# Patient Record
Sex: Female | Born: 1935 | Race: White | Hispanic: No | Smoking: Former smoker
Health system: Southern US, Community
[De-identification: ages and names within clinical notes are randomized; demographics above are authoritative.]

## PROBLEM LIST (undated history)

## (undated) DIAGNOSIS — S32010A Wedge compression fracture of first lumbar vertebra, initial encounter for closed fracture: Secondary | ICD-10-CM

## (undated) DIAGNOSIS — R739 Hyperglycemia, unspecified: Secondary | ICD-10-CM

## (undated) DIAGNOSIS — I4510 Unspecified right bundle-branch block: Secondary | ICD-10-CM

## (undated) DIAGNOSIS — D649 Anemia, unspecified: Secondary | ICD-10-CM

## (undated) DIAGNOSIS — S42291A Other displaced fracture of upper end of right humerus, initial encounter for closed fracture: Secondary | ICD-10-CM

## (undated) DIAGNOSIS — E039 Hypothyroidism, unspecified: Secondary | ICD-10-CM

## (undated) DIAGNOSIS — I10 Essential (primary) hypertension: Secondary | ICD-10-CM

## (undated) DIAGNOSIS — E079 Disorder of thyroid, unspecified: Secondary | ICD-10-CM

## (undated) DIAGNOSIS — J309 Allergic rhinitis, unspecified: Secondary | ICD-10-CM

## (undated) DIAGNOSIS — M810 Age-related osteoporosis without current pathological fracture: Secondary | ICD-10-CM

## (undated) DIAGNOSIS — N3941 Urge incontinence: Secondary | ICD-10-CM

## (undated) DIAGNOSIS — E785 Hyperlipidemia, unspecified: Secondary | ICD-10-CM

## (undated) DIAGNOSIS — N8110 Cystocele, unspecified: Secondary | ICD-10-CM

## (undated) HISTORY — DX: Age-related osteoporosis without current pathological fracture: M81.0

## (undated) HISTORY — DX: Hyperglycemia, unspecified: R73.9

## (undated) HISTORY — DX: Wedge compression fracture of first lumbar vertebra, initial encounter for closed fracture: S32.010A

## (undated) HISTORY — PX: TONSILLECTOMY: SUR1361

## (undated) HISTORY — PX: CATARACT EXTRACTION W/ INTRAOCULAR LENS  IMPLANT, BILATERAL: SHX1307

## (undated) HISTORY — DX: Cystocele, unspecified: N81.10

## (undated) HISTORY — DX: Unspecified right bundle-branch block: I45.10

## (undated) HISTORY — DX: Urge incontinence: N39.41

## (undated) HISTORY — DX: Allergic rhinitis, unspecified: J30.9

## (undated) HISTORY — DX: Hyperlipidemia, unspecified: E78.5

## (undated) HISTORY — PX: APPENDECTOMY: SHX54

## (undated) HISTORY — DX: Anemia, unspecified: D64.9

---

## 1998-05-11 ENCOUNTER — Other Ambulatory Visit: Admission: RE | Admit: 1998-05-11 | Discharge: 1998-05-11 | Payer: Self-pay | Admitting: Cardiology

## 1998-09-12 ENCOUNTER — Other Ambulatory Visit: Admission: RE | Admit: 1998-09-12 | Discharge: 1998-09-12 | Payer: Self-pay | Admitting: Cardiology

## 1999-07-06 ENCOUNTER — Encounter: Payer: Self-pay | Admitting: Cardiology

## 1999-07-06 ENCOUNTER — Ambulatory Visit (HOSPITAL_COMMUNITY): Admission: RE | Admit: 1999-07-06 | Discharge: 1999-07-06 | Payer: Self-pay | Admitting: Cardiology

## 1999-09-17 ENCOUNTER — Other Ambulatory Visit: Admission: RE | Admit: 1999-09-17 | Discharge: 1999-09-17 | Payer: Self-pay | Admitting: Cardiology

## 1999-09-26 ENCOUNTER — Ambulatory Visit (HOSPITAL_COMMUNITY): Admission: RE | Admit: 1999-09-26 | Discharge: 1999-09-26 | Payer: Self-pay | Admitting: Specialist

## 1999-10-24 ENCOUNTER — Ambulatory Visit (HOSPITAL_COMMUNITY): Admission: RE | Admit: 1999-10-24 | Discharge: 1999-10-24 | Payer: Self-pay | Admitting: Specialist

## 2000-07-07 ENCOUNTER — Encounter: Payer: Self-pay | Admitting: Cardiology

## 2000-07-07 ENCOUNTER — Encounter: Admission: RE | Admit: 2000-07-07 | Discharge: 2000-07-07 | Payer: Self-pay | Admitting: Cardiology

## 2000-09-23 ENCOUNTER — Other Ambulatory Visit: Admission: RE | Admit: 2000-09-23 | Discharge: 2000-09-23 | Payer: Self-pay | Admitting: Cardiology

## 2003-05-24 ENCOUNTER — Other Ambulatory Visit: Admission: RE | Admit: 2003-05-24 | Discharge: 2003-05-24 | Payer: Self-pay | Admitting: Geriatric Medicine

## 2008-02-11 ENCOUNTER — Inpatient Hospital Stay (HOSPITAL_COMMUNITY): Admission: EM | Admit: 2008-02-11 | Discharge: 2008-02-16 | Payer: Self-pay | Admitting: Emergency Medicine

## 2008-02-16 ENCOUNTER — Ambulatory Visit: Payer: Self-pay | Admitting: Internal Medicine

## 2008-03-08 LAB — CBC WITH DIFFERENTIAL/PLATELET
BASO%: 0.6 % (ref 0.0–2.0)
Basophils Absolute: 0 10*3/uL (ref 0.0–0.1)
EOS%: 3.1 % (ref 0.0–7.0)
HCT: 37 % (ref 34.8–46.6)
HGB: 12.6 g/dL (ref 11.6–15.9)
MCH: 30.8 pg (ref 26.0–34.0)
MONO#: 0.4 10*3/uL (ref 0.1–0.9)
NEUT%: 68.6 % (ref 39.6–76.8)
RDW: 14.6 % — ABNORMAL HIGH (ref 11.3–14.5)
WBC: 6 10*3/uL (ref 3.9–10.0)
lymph#: 1.2 10*3/uL (ref 0.9–3.3)

## 2008-03-08 LAB — COMPREHENSIVE METABOLIC PANEL
AST: 15 U/L (ref 0–37)
Albumin: 4.5 g/dL (ref 3.5–5.2)
Alkaline Phosphatase: 51 U/L (ref 39–117)
BUN: 18 mg/dL (ref 6–23)
Calcium: 9.2 mg/dL (ref 8.4–10.5)
Chloride: 105 mEq/L (ref 96–112)
Potassium: 4.1 mEq/L (ref 3.5–5.3)
Sodium: 142 mEq/L (ref 135–145)
Total Protein: 6.6 g/dL (ref 6.0–8.3)

## 2009-11-01 ENCOUNTER — Other Ambulatory Visit: Admission: RE | Admit: 2009-11-01 | Discharge: 2009-11-01 | Payer: Self-pay | Admitting: Obstetrics and Gynecology

## 2011-03-12 NOTE — Consult Note (Signed)
NAMEJOHNNAY, PLEITEZ           ACCOUNT NO.:  [id number]   MEDICAL RECORD NO.:  [id number]          PATIENT TYPE:  EMS   LOCATION:  ED                           FACILITY:  [facility]   PHYSICIAN:  Griffith Citron, M.D.DATE OF BIRTH:  09-16-1922   DATE OF CONSULTATION:  02/10/2008  DATE OF DISCHARGE:                                 CONSULTATION   PROCEDURE:  Nausea, vomiting, and diarrhea.   HISTORY OF PRESENT ILLNESS:  Ms. Storr is an 77 year old white female  well known to me, currently undergoing GI evaluation.  The patient was  seen initially in consultation by me two weeks ago as an outpatient with  a six week history of episodic nausea and vomiting.  She had had three  discrete episodes during the prior several weeks but no episode for at  least three weeks prior to her initial GI consultation.  Bowel habits at  that time tended towards constipation.  There were no obvious physical  findings.  With the exception of a gallbladder ultrasound, no additional  evaluation was undertaken.  Abdominal ultrasound was negative for  gallstones with mild atherosclerotic change of the aorta.  The patient  was followed expectantly.   Three days ago, the patient was seen after a two week hiatus because of  change in bowel habits with diarrhea occurring on an almost daily basis,  5-10 watery stools per day, no blood, pus or mucous.  Urgency with  minimal cramping and no complaints of abdominal distention or pain.  Symptoms appeared to be independent of food intake or physical activity.  Associated with the change in bowel habits, she was experiencing nausea  and had at least one episode of vomiting.  Further evaluation included  lab with mild leukocytosis, mild hypokalemia with normal celiac profile.  Abdominal two way film was obtained, but the report is pending at the  present time.   The patient, again today, had the acute recurrence of episodic nausea,  vomiting, and diarrhea.  This time  becoming pre-syncopal.  She is in a  rehabilitation home and was brought to [facility] Emergency Room by her  son because of her debilitated state, ongoing symptoms, without  diagnosis or therapeutic plan.   PAST MEDICAL HISTORY:  Hypertension, hypothyroid.   REVIEW OF SYSTEMS:  Night sweats beginning during the past two weeks.  Decreased energy.  Appetite remains good.  Weight has increased.   PERSONAL/SOCIAL:  The patient is widowed.  She lives in an assisted  living facility.  She is independent driving.  Her son, Gregary Signs, is her  primary caregiver.  Her daughter-in-law, is an Charity fundraiser, Marnette Burgess.   PHYSICAL EXAMINATION:  GENERAL:  Elderly white female, alert, talkative.  Vital signs stable.  No acute distress.  Afebrile.  HEENT:  Anicteric sclerae, pink conjunctivae, EOMI.  Mouth dry mucous  membranes.  No oropharyngeal lesion of lip, tongue, or gums.  NECK: Supple, no adenopathy, no thyromegaly or bruits.  CHEST:  Clear to auscultation without adventitious sounds.  CARDIAC:  Regular rhythm, no gallop, no murmur.  ABDOMEN:  Soft, nontender, nondistended, no palpable organomegaly, no  firmness,  no mass.  Bowel sounds are present throughout.  RECTAL:  Not performed.  EXTREMITIES:  Peripheral wasting.  No edema, no clubbing or cyanosis.  Good capillary refill.  Pulses equal bilaterally.   ASSESSMENT:  1. Diarrhea.  2. Nausea and vomiting.  3. Dehydration secondary to above.   The etiology of the patient's current illness is unknown.  It is  characterized by episodic nausea, vomiting, and diarrhea.  Associated  symptoms of weakness, decreased energy, and night sweats are more recent  features.  It is not clear if this is upper or lower GI in origin with  considerations including peptic disease, Candida esophagitis, infectious  colitis including possible pseudomembranous colitis, intestinal angina,  or ischemic colitis.  An underlying systemic disorder also needs to be  considered  with secondary GI manifestations, e.g. Addison's disease,  lymphoma.  GI neoplasia is also well within the differential.   RECOMMENDATIONS:  1. Hydrate, replace potassium.  2. Clear liquids.  3. Anti-emetics.  4. Panendoscopy and flexible sigmoidoscopy anticipated for tomorrow.  5. Stool evaluation, hemoccult x3, C&S, WBC, C. difficile toxin, EIA-      Giardia, microsporidia.  6. PPI - Protonix 40 mg IV q.24h.  7. Consider abdominal CT, mesenteric angiogram.      Griffith Citron, M.D.  Electronically Signed     JRM/MEDQ  D:  02/10/2008  T:  02/10/2008  Job:  [id number]   cc:   Hal T. Stoneking, M.D.  Fax: [phone]

## 2011-03-12 NOTE — Discharge Summary (Signed)
Emily Spence, Emily Spence           ACCOUNT NO.:  [id number]   MEDICAL RECORD NO.:  [id number]          PATIENT TYPE:  INP   LOCATION:  1437                         FACILITY:  [facility]   PHYSICIAN:  Corinna L. Lendell Caprice, MDDATE OF BIRTH:  12-05-1921   DATE OF ADMISSION:  02/11/2008  DATE OF DISCHARGE:  02/16/2008                               DISCHARGE SUMMARY   DIAGNOSES:  1. Intermittent diarrhea and nausea  2. Dehydration.  3. Hypokalemia.  4. Hypertension.  5. Hypothyroidism.  6. Do not resuscitate.   MEDICATIONS:  1. Prilosec 20 mg a day.  2. Cholestyramine 4 gm twice a day as needed for diarrhea.  3. She is to continue lisinopril 5 mg a day.  4. Hydrochlorothiazide 12.5 mg a day.  5. Synthroid 50 mcg a day.  6. Felodipine ER 2.5 mg a day.   Follow up with Dr. Kinnie Scales next week.  Follow up with Dr. Lajuana Matte for anemia workup.   CONDITION:  Stable.   CONSULTATIONS:  Dr. Sharrell Ku.   PROCEDURES:  EGD, which showed erosive esophagitis, Schatzki's ring,  savary dilatation, hiatal hernia.  Colonoscopy showed hemorrhoids,  diverticulosis.  Capsule endoscopy result unavailable.   DISCHARGE DIET:  As tolerated.   ACTIVITY:  Ad lib.   LABORATORY DATA:  CBC initially significant for a white blood cell count  of 12,000, but this normalized without antibiotics.  Her eosinophil  count was 12%.  On February 13, 2008, white blood cell count was 10,000  with 47% neutrophils, 14% lymphocytes and 34% eosinophils.  Erythrocyte  sedimentation rate was 1.  INR 1.2, PTT 26.  Initial complete metabolic  panel significant for a potassium of 3.0, total protein 5.5, albumin  3.1.  At discharge, her potassium was 3.5.  Point of care enzymes  negative.  TSH normal.  Gastrin normal at 28.  Serum IgA level was  normal.  Serum IgG level was slightly low at 673.  Serum IgM level was  low at 16.  Serum protein electrophoresis negative for an M spike.  A 24-  hour urine for  5-hydroxyindoleacetic was normal at 1.8.  Urine  metanephrines normal.  Urinalysis showed a specific gravity of 1.007,  trace blood, trace leukocyte esterase, negative nitrites.  ANA negative.  Giardia negative.  Cryptosporidia negative.  Hemoccult of the stool was  positive.  C diff was negative x2.  Stool culture was negative.  Fecal  lactoferrin was positive.  Ova and parasite negative.   SPECIAL STUDIES/RADIOLOGY:  Abdominal x-ray was nonobstructive.  CT of  the abdomen and pelvis showed nothing acute, likely small gallstones.  Small bowel series showed nothing acute.  EKG showed a normal sinus  rhythm, right bundle branch block.   HISTORY AND HOSPITAL COURSE:  Please see H&P for complete admission  details.  Emily Spence is an 77 year old white female with periodic  intermittent diarrhea and nausea.  This has been occurring for several  weeks, usually several days in a row and then stops.  She has 5-10  watery stools a day.  During this time, she had been seen by Dr. Kinnie Scales  as an outpatient and had a negative workup for celiac sprue, but  presented to the emergency room with weakness, nausea and continued  diarrhea.  She was found to have normal vital signs, dry mucous  membranes, poor skin turgor, soft abdomen.  She was hypokalemic and felt  to be dehydrated.  She was started on IV fluids.  Her  hydrochlorothiazide and lisinopril were held.  Her potassium was  repleted.  Dr. Kinnie Scales was consulted and performed the above procedures.  He ordered a capsule endoscopy, and I do not have this result.  Small  bowel follow-through was normal.  The patient's diarrhea resolved and  she was tolerating a regular diet.  Potassium was adequately repleted.  She was started back on her antihypertensives and feeling much better.  Her nausea and vomiting had resolved and she was ready to go home.  Further workup per Dr. Kinnie Scales.      Corinna L. Lendell Caprice, MD  Electronically Signed     CLS/MEDQ   D:  02/23/2008  T:  02/23/2008  Job:  [id number]   cc:   Hal T. Stoneking, M.D.  Fax: [phone]   Lajuana Matte, MD  Fax: [phone]

## 2011-03-12 NOTE — H&P (Signed)
Emily Spence, Emily Spence           ACCOUNT NO.:  [id number]   MEDICAL RECORD NO.:  [id number]          PATIENT TYPE:  EMS   LOCATION:  ED                           [facility]ACILITY:  [facility]   PHYSICIAN:  Michiel Cowboy, MDDATE OF BIRTH:  14-Feb-1922   DATE OF ADMISSION:  02/10/2008  DATE OF DISCHARGE:                              HISTORY & PHYSICAL   PRIMARY CARE PHYSICIAN:  Hal T. Stoneking, M.D.   CHIEF COMPLAINT:  Nausea, vomiting, and diarrhea.   CONSULTATIONS:  Griffith Citron, M.D., GI.   HISTORY OF PRESENT ILLNESS:  The patient is an 77 year old female, well  known to Dr. Kinnie Scales who for the past week or so has been suffering from  intermittent episodes of diarrhea.  The patient describes episodes of  having frequent bowel movements for four days, spontaneously resolved  and then resume again.  For the past 24 hours, she has been having  frequent bowel movements and runny bowel movements, about five or six by  now and they do not stop.  She also has been vomiting and been nauseous  which is new for her.  Of note, the patient has had history of  constipation actually in the past which has triggered GI consultation  and the nausea, vomiting, and diarrhea has been a drastic change from  prior.  Per Dr. Jennye Boroughs note, she has already undergone work-up for  celiac sprue which was negative, plain films and ultrasound was obtained  which were within normal limits per family.  Dr. Kinnie Scales is planning to  perform an endoscopy on the patient tomorrow for further evaluation of  her symptoms, but wishes medicine to admit the patient overnight and  hydrate her as the patient continues to have nausea, vomiting and  diarrhea.   REVIEW OF SYSTEMS:  Denies fevers, chills, abdominal pain, chest pain,  shortness of breath, otherwise negative except for his HPI.   PAST MEDICAL HISTORY:  1. Hypertension.  2. Hypothyroidism.   ALLERGIES:  SULFA.   MEDICATIONS:  Lisinopril, Synthroid, and  hydrochlorothiazide, the doses  of which she does not know.   SOCIAL HISTORY:  The patient lives in the [facility].  Denies alcohol,  drugs or tobacco abuse.  Lives in an assisted living facility.   FAMILY HISTORY:  Noncontributory.   PHYSICAL EXAMINATION:  VITAL SIGNS:  Blood pressure 136/60, respirations  18, heart rate 74, temperature 98.9, oxygen saturation 93% on room air.  GENERAL:  The patient is an elderly female in no acute distress, lying  down on the stretcher.  Dry mucous membranes.  Decreased skin turgor.  HEENT:  Atraumatic.  LUNGS:  Clear to auscultation bilaterally.  HEART:  Regular rate and rhythm.  No murmurs, rubs or gallops.  ABDOMEN:  Soft, nontender, nondistended.  EXTREMITIES:  Lower extremities without edema.  NEUROLOGIC:  Intact.   LABORATORY DATA:  White blood cell count 12.7, hemoglobin 12.3,  platelets 175.  Potassium 3, sodium 138, bicarb 22, creatinine 0.7.  LFTs within normal limits.  Total protein 5.5, albumin 3.1.  Urinalysis  positive leukocyte esterase, but wbc's 0-2.  No films obtained in the  __________.  EKG showing right bundle branch block with no evidence of  acute ischemia and function.   ASSESSMENT/PLAN:  This is an 77 year old female with history of nausea,  vomiting, and diarrhea for the past one week and now nausea and vomiting  for the past 24 hours and worsening diarrhea.  1. Nausea and vomiting and diarrhea.  As per Dr. Jennye Boroughs note,      etiology unclear.  Defer to GI for further work-up.  Would also      send stool studies and Clostridium difficile, lactate levels as      this could be secondary to ischemic process. Obtain plain film of      the abdomen to be sure no new developments have happened since the      patient has been worsening for the past 24 hour.  Refer to GI if      other imaging would be necessary.  2. Dehydration.  Will rehydrate overnight with normal saline.  3. Hypokalemia.  Will put her on telemetry and  replace.  Will follow      labs in the morning.  4. Hypertension.  Will hold HCTZ and lisinopril for now given that the      patient is dehydrated.  Will watch blood pressure and restart      medications as needed.  5. Hypothyroidism.  Check TSH level.  Restart home dose of medications      once son brings them in so we know what dose the patient is on.  6. Prophylaxis. Protonix and SCDs.  7. Code status. The patient is DNR/DNI per discussion with the family      and patient, but wishes aggressive care up to a point but do not      include resuscitation.      Michiel Cowboy, MD  Electronically Signed     AVD/MEDQ  D:  02/11/2008  T:  02/11/2008  Job:  [id number]   cc:   Griffith Citron, M.D.  Fax: [phone]   Hal T. Stoneking, M.D.  Fax: [phone]

## 2011-07-23 LAB — DIFFERENTIAL
Basophils Absolute: 0
Basophils Relative: 0
Basophils Relative: 0
Eosinophils Absolute: 1.5 — ABNORMAL HIGH
Eosinophils Relative: 12 — ABNORMAL HIGH
Eosinophils Relative: 34 — ABNORMAL HIGH
Lymphocytes Relative: 6 — ABNORMAL LOW
Lymphs Abs: 0.8
Monocytes Absolute: 0.5
Monocytes Absolute: 0.6
Monocytes Relative: 5
Neutro Abs: 4.7
Neutro Abs: 9.8 — ABNORMAL HIGH
Neutrophils Relative %: 47
Neutrophils Relative %: 77

## 2011-07-23 LAB — CLOSTRIDIUM DIFFICILE EIA
C difficile Toxins A+B, EIA: NEGATIVE
C difficile Toxins A+B, EIA: NEGATIVE

## 2011-07-23 LAB — CBC
HCT: 30.5 — ABNORMAL LOW
HCT: 37.2
Hemoglobin: 10.3 — ABNORMAL LOW
Hemoglobin: 10.4 — ABNORMAL LOW
Hemoglobin: 11 — ABNORMAL LOW
Hemoglobin: 12.3
MCHC: 33
MCHC: 33.5
MCHC: 33.7
MCHC: 33.8
MCV: 90.5
MCV: 90.9
MCV: 91.2
MCV: 91.9
Platelets: 175
RBC: 3.39 — ABNORMAL LOW
RBC: 3.57 — ABNORMAL LOW
RBC: 4.09
RDW: 13.2
RDW: 13.6
RDW: 14.6
WBC: 10
WBC: 12.7 — ABNORMAL HIGH

## 2011-07-23 LAB — BASIC METABOLIC PANEL
CO2: 21
CO2: 24
CO2: 29
Calcium: 7.3 — ABNORMAL LOW
Chloride: 106
Chloride: 113 — ABNORMAL HIGH
Creatinine, Ser: 0.6
Creatinine, Ser: 0.6
GFR calc Af Amer: >60
GFR calc Af Amer: >60
GFR calc non Af Amer: >60
Glucose, Bld: 107 — ABNORMAL HIGH
Glucose, Bld: 95
Potassium: 3.7
Sodium: 138
Sodium: 140
Sodium: 142

## 2011-07-23 LAB — URINALYSIS, ROUTINE W REFLEX MICROSCOPIC
Bilirubin Urine: NEGATIVE
Glucose, UA: NEGATIVE
Hgb urine dipstick: NEGATIVE
Nitrite: NEGATIVE
Protein, ur: NEGATIVE
Specific Gravity, Urine: 1.007
Urobilinogen, UA: 0.2
pH: 6

## 2011-07-23 LAB — GIARDIA/CRYPTOSPORIDIUM SCREEN(EIA)

## 2011-07-23 LAB — LACTIC ACID, PLASMA: Lactic Acid, Venous: 0.7

## 2011-07-23 LAB — COMPREHENSIVE METABOLIC PANEL
ALT: 13
AST: 24
Albumin: 2.4 — ABNORMAL LOW
Albumin: 3.1 — ABNORMAL LOW
Alkaline Phosphatase: 46
BUN: 5 — ABNORMAL LOW
Calcium: 8.5
Chloride: 113 — ABNORMAL HIGH
Creatinine, Ser: 0.67
GFR calc Af Amer: >60
GFR calc non Af Amer: >60
Glucose, Bld: 111 — ABNORMAL HIGH
Potassium: 4
Sodium: 138
Total Bilirubin: 0.8
Total Protein: 5.5 — ABNORMAL LOW

## 2011-07-23 LAB — PROTIME-INR
INR: 1.2
Prothrombin Time: 15.5 — ABNORMAL HIGH

## 2011-07-23 LAB — URINE MICROSCOPIC-ADD ON

## 2011-07-23 LAB — 5 HIAA, QUANTITATIVE, URINE, 24 HOUR
5-HIAA, 24 Hr Urine: 1.8 mg/24 h (ref <=6.0)
Volume, Urine-5HIAA: 2500 mL/24 h

## 2011-07-23 LAB — COMPREHENSIVE METABOLIC PANEL WITH GFR
Alkaline Phosphatase: 61
BUN: 8
CO2: 22
Chloride: 106
Glucose, Bld: 120 — ABNORMAL HIGH
Potassium: 3 — ABNORMAL LOW
Total Bilirubin: 1.1

## 2011-07-23 LAB — LIPASE, BLOOD: Lipase: 17

## 2011-07-23 LAB — OVA AND PARASITE EXAMINATION: Ova and parasites: NONE SEEN

## 2011-07-23 LAB — PROTEIN ELECTROPH W RFLX QUANT IMMUNOGLOBULINS
Alpha-2-Globulin: 14.5 — ABNORMAL HIGH
Beta 2: 4.5
Beta Globulin: 5.7
Gamma Globulin: 11 — ABNORMAL LOW
M-Spike, %: NOT DETECTED
Total Protein ELP: 5.6 — ABNORMAL LOW

## 2011-07-23 LAB — GASTRIN: Gastrin: 28 pg/mL (ref 13–115)

## 2011-07-23 LAB — IGG, IGA, IGM
IgA: 190
IgG (Immunoglobin G), Serum: 673 — ABNORMAL LOW
IgM, Serum: 16 — ABNORMAL LOW

## 2011-07-23 LAB — METANEPHRINES, URINE, 24 HOUR: Volume, Urine-METAN: 2500

## 2011-07-23 LAB — OCCULT BLOOD X 1 CARD TO LAB, STOOL: Fecal Occult Bld: POSITIVE

## 2011-07-23 LAB — STOOL CULTURE

## 2011-07-23 LAB — IMMUNOFIXATION ADD-ON

## 2011-12-03 DIAGNOSIS — I10 Essential (primary) hypertension: Secondary | ICD-10-CM | POA: Diagnosis not present

## 2011-12-11 DIAGNOSIS — H3530 Unspecified macular degeneration: Secondary | ICD-10-CM | POA: Diagnosis not present

## 2012-04-21 DIAGNOSIS — R110 Nausea: Secondary | ICD-10-CM | POA: Diagnosis not present

## 2012-04-21 DIAGNOSIS — M549 Dorsalgia, unspecified: Secondary | ICD-10-CM | POA: Diagnosis not present

## 2012-04-23 DIAGNOSIS — R279 Unspecified lack of coordination: Secondary | ICD-10-CM | POA: Diagnosis not present

## 2012-04-23 DIAGNOSIS — R269 Unspecified abnormalities of gait and mobility: Secondary | ICD-10-CM | POA: Diagnosis not present

## 2012-04-23 DIAGNOSIS — M810 Age-related osteoporosis without current pathological fracture: Secondary | ICD-10-CM | POA: Diagnosis not present

## 2012-04-23 DIAGNOSIS — Z9181 History of falling: Secondary | ICD-10-CM | POA: Diagnosis not present

## 2012-04-23 DIAGNOSIS — M6281 Muscle weakness (generalized): Secondary | ICD-10-CM | POA: Diagnosis not present

## 2012-04-23 DIAGNOSIS — M545 Low back pain: Secondary | ICD-10-CM | POA: Diagnosis not present

## 2012-04-24 DIAGNOSIS — R279 Unspecified lack of coordination: Secondary | ICD-10-CM | POA: Diagnosis not present

## 2012-04-24 DIAGNOSIS — M810 Age-related osteoporosis without current pathological fracture: Secondary | ICD-10-CM | POA: Diagnosis not present

## 2012-04-24 DIAGNOSIS — M6281 Muscle weakness (generalized): Secondary | ICD-10-CM | POA: Diagnosis not present

## 2012-04-24 DIAGNOSIS — Z9181 History of falling: Secondary | ICD-10-CM | POA: Diagnosis not present

## 2012-04-24 DIAGNOSIS — R269 Unspecified abnormalities of gait and mobility: Secondary | ICD-10-CM | POA: Diagnosis not present

## 2012-04-24 DIAGNOSIS — M545 Low back pain: Secondary | ICD-10-CM | POA: Diagnosis not present

## 2012-04-27 DIAGNOSIS — M6281 Muscle weakness (generalized): Secondary | ICD-10-CM | POA: Diagnosis not present

## 2012-04-27 DIAGNOSIS — R279 Unspecified lack of coordination: Secondary | ICD-10-CM | POA: Diagnosis not present

## 2012-04-27 DIAGNOSIS — R269 Unspecified abnormalities of gait and mobility: Secondary | ICD-10-CM | POA: Diagnosis not present

## 2012-04-27 DIAGNOSIS — M810 Age-related osteoporosis without current pathological fracture: Secondary | ICD-10-CM | POA: Diagnosis not present

## 2012-04-27 DIAGNOSIS — M545 Low back pain, unspecified: Secondary | ICD-10-CM | POA: Diagnosis not present

## 2012-04-27 DIAGNOSIS — Z9181 History of falling: Secondary | ICD-10-CM | POA: Diagnosis not present

## 2012-04-28 DIAGNOSIS — F05 Delirium due to known physiological condition: Secondary | ICD-10-CM | POA: Diagnosis not present

## 2012-04-28 DIAGNOSIS — M8448XD Pathological fracture, other site, subsequent encounter for fracture with routine healing: Secondary | ICD-10-CM | POA: Diagnosis not present

## 2012-04-28 DIAGNOSIS — I10 Essential (primary) hypertension: Secondary | ICD-10-CM | POA: Diagnosis not present

## 2012-05-21 DIAGNOSIS — D649 Anemia, unspecified: Secondary | ICD-10-CM | POA: Diagnosis not present

## 2012-05-21 DIAGNOSIS — M810 Age-related osteoporosis without current pathological fracture: Secondary | ICD-10-CM | POA: Diagnosis not present

## 2012-05-21 DIAGNOSIS — I10 Essential (primary) hypertension: Secondary | ICD-10-CM | POA: Diagnosis not present

## 2012-05-21 DIAGNOSIS — Z79899 Other long term (current) drug therapy: Secondary | ICD-10-CM | POA: Diagnosis not present

## 2012-05-21 DIAGNOSIS — Z1331 Encounter for screening for depression: Secondary | ICD-10-CM | POA: Diagnosis not present

## 2012-06-01 DIAGNOSIS — R279 Unspecified lack of coordination: Secondary | ICD-10-CM | POA: Diagnosis not present

## 2012-06-01 DIAGNOSIS — M545 Low back pain: Secondary | ICD-10-CM | POA: Diagnosis not present

## 2012-06-01 DIAGNOSIS — Z9181 History of falling: Secondary | ICD-10-CM | POA: Diagnosis not present

## 2012-06-03 DIAGNOSIS — Z9181 History of falling: Secondary | ICD-10-CM | POA: Diagnosis not present

## 2012-06-03 DIAGNOSIS — M545 Low back pain: Secondary | ICD-10-CM | POA: Diagnosis not present

## 2012-06-03 DIAGNOSIS — R279 Unspecified lack of coordination: Secondary | ICD-10-CM | POA: Diagnosis not present

## 2012-06-05 DIAGNOSIS — Z9181 History of falling: Secondary | ICD-10-CM | POA: Diagnosis not present

## 2012-06-05 DIAGNOSIS — M545 Low back pain: Secondary | ICD-10-CM | POA: Diagnosis not present

## 2012-06-05 DIAGNOSIS — R279 Unspecified lack of coordination: Secondary | ICD-10-CM | POA: Diagnosis not present

## 2012-06-10 DIAGNOSIS — M545 Low back pain: Secondary | ICD-10-CM | POA: Diagnosis not present

## 2012-06-10 DIAGNOSIS — Z9181 History of falling: Secondary | ICD-10-CM | POA: Diagnosis not present

## 2012-06-10 DIAGNOSIS — R279 Unspecified lack of coordination: Secondary | ICD-10-CM | POA: Diagnosis not present

## 2012-06-15 DIAGNOSIS — R279 Unspecified lack of coordination: Secondary | ICD-10-CM | POA: Diagnosis not present

## 2012-06-15 DIAGNOSIS — M545 Low back pain: Secondary | ICD-10-CM | POA: Diagnosis not present

## 2012-06-15 DIAGNOSIS — Z9181 History of falling: Secondary | ICD-10-CM | POA: Diagnosis not present

## 2012-06-16 DIAGNOSIS — Z9181 History of falling: Secondary | ICD-10-CM | POA: Diagnosis not present

## 2012-06-16 DIAGNOSIS — M545 Low back pain: Secondary | ICD-10-CM | POA: Diagnosis not present

## 2012-06-16 DIAGNOSIS — R279 Unspecified lack of coordination: Secondary | ICD-10-CM | POA: Diagnosis not present

## 2012-06-17 DIAGNOSIS — Z9181 History of falling: Secondary | ICD-10-CM | POA: Diagnosis not present

## 2012-06-17 DIAGNOSIS — R279 Unspecified lack of coordination: Secondary | ICD-10-CM | POA: Diagnosis not present

## 2012-06-17 DIAGNOSIS — M545 Low back pain: Secondary | ICD-10-CM | POA: Diagnosis not present

## 2012-06-18 DIAGNOSIS — R279 Unspecified lack of coordination: Secondary | ICD-10-CM | POA: Diagnosis not present

## 2012-06-18 DIAGNOSIS — M545 Low back pain: Secondary | ICD-10-CM | POA: Diagnosis not present

## 2012-06-18 DIAGNOSIS — Z9181 History of falling: Secondary | ICD-10-CM | POA: Diagnosis not present

## 2012-06-19 DIAGNOSIS — M545 Low back pain: Secondary | ICD-10-CM | POA: Diagnosis not present

## 2012-06-19 DIAGNOSIS — Z9181 History of falling: Secondary | ICD-10-CM | POA: Diagnosis not present

## 2012-06-19 DIAGNOSIS — R279 Unspecified lack of coordination: Secondary | ICD-10-CM | POA: Diagnosis not present

## 2012-06-22 DIAGNOSIS — M545 Low back pain: Secondary | ICD-10-CM | POA: Diagnosis not present

## 2012-06-22 DIAGNOSIS — Z9181 History of falling: Secondary | ICD-10-CM | POA: Diagnosis not present

## 2012-06-22 DIAGNOSIS — R279 Unspecified lack of coordination: Secondary | ICD-10-CM | POA: Diagnosis not present

## 2012-06-24 DIAGNOSIS — Z9181 History of falling: Secondary | ICD-10-CM | POA: Diagnosis not present

## 2012-06-24 DIAGNOSIS — M545 Low back pain: Secondary | ICD-10-CM | POA: Diagnosis not present

## 2012-06-24 DIAGNOSIS — R279 Unspecified lack of coordination: Secondary | ICD-10-CM | POA: Diagnosis not present

## 2012-06-25 DIAGNOSIS — R279 Unspecified lack of coordination: Secondary | ICD-10-CM | POA: Diagnosis not present

## 2012-06-25 DIAGNOSIS — M545 Low back pain: Secondary | ICD-10-CM | POA: Diagnosis not present

## 2012-06-25 DIAGNOSIS — Z9181 History of falling: Secondary | ICD-10-CM | POA: Diagnosis not present

## 2012-06-26 DIAGNOSIS — R279 Unspecified lack of coordination: Secondary | ICD-10-CM | POA: Diagnosis not present

## 2012-06-26 DIAGNOSIS — M545 Low back pain: Secondary | ICD-10-CM | POA: Diagnosis not present

## 2012-06-26 DIAGNOSIS — Z9181 History of falling: Secondary | ICD-10-CM | POA: Diagnosis not present

## 2012-06-29 DIAGNOSIS — M545 Low back pain: Secondary | ICD-10-CM | POA: Diagnosis not present

## 2012-06-29 DIAGNOSIS — R279 Unspecified lack of coordination: Secondary | ICD-10-CM | POA: Diagnosis not present

## 2012-06-29 DIAGNOSIS — Z9181 History of falling: Secondary | ICD-10-CM | POA: Diagnosis not present

## 2012-07-01 DIAGNOSIS — Z9181 History of falling: Secondary | ICD-10-CM | POA: Diagnosis not present

## 2012-07-01 DIAGNOSIS — R279 Unspecified lack of coordination: Secondary | ICD-10-CM | POA: Diagnosis not present

## 2012-07-01 DIAGNOSIS — M545 Low back pain: Secondary | ICD-10-CM | POA: Diagnosis not present

## 2012-07-08 DIAGNOSIS — Z9181 History of falling: Secondary | ICD-10-CM | POA: Diagnosis not present

## 2012-07-08 DIAGNOSIS — R279 Unspecified lack of coordination: Secondary | ICD-10-CM | POA: Diagnosis not present

## 2012-07-08 DIAGNOSIS — M545 Low back pain: Secondary | ICD-10-CM | POA: Diagnosis not present

## 2012-07-23 DIAGNOSIS — I998 Other disorder of circulatory system: Secondary | ICD-10-CM | POA: Diagnosis not present

## 2012-07-23 DIAGNOSIS — D649 Anemia, unspecified: Secondary | ICD-10-CM | POA: Diagnosis not present

## 2012-07-23 DIAGNOSIS — E039 Hypothyroidism, unspecified: Secondary | ICD-10-CM | POA: Diagnosis not present

## 2012-07-23 DIAGNOSIS — I10 Essential (primary) hypertension: Secondary | ICD-10-CM | POA: Diagnosis not present

## 2012-07-23 DIAGNOSIS — Z23 Encounter for immunization: Secondary | ICD-10-CM | POA: Diagnosis not present

## 2012-08-19 DIAGNOSIS — H905 Unspecified sensorineural hearing loss: Secondary | ICD-10-CM | POA: Diagnosis not present

## 2012-11-11 DIAGNOSIS — H3530 Unspecified macular degeneration: Secondary | ICD-10-CM | POA: Diagnosis not present

## 2013-01-14 DIAGNOSIS — I10 Essential (primary) hypertension: Secondary | ICD-10-CM | POA: Diagnosis not present

## 2013-01-14 DIAGNOSIS — D649 Anemia, unspecified: Secondary | ICD-10-CM | POA: Diagnosis not present

## 2013-01-22 DIAGNOSIS — L539 Erythematous condition, unspecified: Secondary | ICD-10-CM | POA: Diagnosis not present

## 2013-01-22 DIAGNOSIS — R079 Chest pain, unspecified: Secondary | ICD-10-CM | POA: Diagnosis not present

## 2013-01-22 DIAGNOSIS — R21 Rash and other nonspecific skin eruption: Secondary | ICD-10-CM | POA: Diagnosis not present

## 2013-07-01 DIAGNOSIS — E039 Hypothyroidism, unspecified: Secondary | ICD-10-CM | POA: Diagnosis not present

## 2013-07-01 DIAGNOSIS — Z79899 Other long term (current) drug therapy: Secondary | ICD-10-CM | POA: Diagnosis not present

## 2013-07-01 DIAGNOSIS — I10 Essential (primary) hypertension: Secondary | ICD-10-CM | POA: Diagnosis not present

## 2013-08-18 DIAGNOSIS — H3530 Unspecified macular degeneration: Secondary | ICD-10-CM | POA: Diagnosis not present

## 2013-08-18 DIAGNOSIS — Z961 Presence of intraocular lens: Secondary | ICD-10-CM | POA: Diagnosis not present

## 2013-10-29 DIAGNOSIS — L820 Inflamed seborrheic keratosis: Secondary | ICD-10-CM | POA: Diagnosis not present

## 2013-10-29 DIAGNOSIS — L570 Actinic keratosis: Secondary | ICD-10-CM | POA: Diagnosis not present

## 2013-12-30 DIAGNOSIS — I10 Essential (primary) hypertension: Secondary | ICD-10-CM | POA: Diagnosis not present

## 2013-12-30 DIAGNOSIS — Z0000 Encounter for general adult medical examination without abnormal findings: Secondary | ICD-10-CM | POA: Diagnosis not present

## 2013-12-30 DIAGNOSIS — Z1331 Encounter for screening for depression: Secondary | ICD-10-CM | POA: Diagnosis not present

## 2013-12-30 DIAGNOSIS — Z23 Encounter for immunization: Secondary | ICD-10-CM | POA: Diagnosis not present

## 2013-12-30 DIAGNOSIS — Z7189 Other specified counseling: Secondary | ICD-10-CM | POA: Diagnosis not present

## 2014-06-24 DIAGNOSIS — L578 Other skin changes due to chronic exposure to nonionizing radiation: Secondary | ICD-10-CM | POA: Diagnosis not present

## 2014-06-24 DIAGNOSIS — L570 Actinic keratosis: Secondary | ICD-10-CM | POA: Diagnosis not present

## 2014-06-24 DIAGNOSIS — L738 Other specified follicular disorders: Secondary | ICD-10-CM | POA: Diagnosis not present

## 2014-07-07 DIAGNOSIS — I10 Essential (primary) hypertension: Secondary | ICD-10-CM | POA: Diagnosis not present

## 2014-07-07 DIAGNOSIS — Z79899 Other long term (current) drug therapy: Secondary | ICD-10-CM | POA: Diagnosis not present

## 2014-07-07 DIAGNOSIS — E039 Hypothyroidism, unspecified: Secondary | ICD-10-CM | POA: Diagnosis not present

## 2014-07-15 DIAGNOSIS — L570 Actinic keratosis: Secondary | ICD-10-CM | POA: Diagnosis not present

## 2014-07-15 DIAGNOSIS — L578 Other skin changes due to chronic exposure to nonionizing radiation: Secondary | ICD-10-CM | POA: Diagnosis not present

## 2014-07-20 DIAGNOSIS — Z961 Presence of intraocular lens: Secondary | ICD-10-CM | POA: Diagnosis not present

## 2014-07-20 DIAGNOSIS — H3530 Unspecified macular degeneration: Secondary | ICD-10-CM | POA: Diagnosis not present

## 2014-10-14 DIAGNOSIS — L578 Other skin changes due to chronic exposure to nonionizing radiation: Secondary | ICD-10-CM | POA: Diagnosis not present

## 2014-10-14 DIAGNOSIS — L570 Actinic keratosis: Secondary | ICD-10-CM | POA: Diagnosis not present

## 2014-10-14 DIAGNOSIS — L820 Inflamed seborrheic keratosis: Secondary | ICD-10-CM | POA: Diagnosis not present

## 2014-11-01 DIAGNOSIS — I10 Essential (primary) hypertension: Secondary | ICD-10-CM | POA: Diagnosis not present

## 2014-11-01 DIAGNOSIS — M545 Low back pain: Secondary | ICD-10-CM | POA: Diagnosis not present

## 2014-11-30 DIAGNOSIS — S42291A Other displaced fracture of upper end of right humerus, initial encounter for closed fracture: Secondary | ICD-10-CM | POA: Diagnosis not present

## 2014-12-01 ENCOUNTER — Emergency Department (HOSPITAL_COMMUNITY): Payer: Medicare Other

## 2014-12-01 ENCOUNTER — Emergency Department (HOSPITAL_COMMUNITY)
Admission: EM | Admit: 2014-12-01 | Discharge: 2014-12-01 | Disposition: A | Discharge status: Home / Self Care | Payer: Medicare Other | Attending: Emergency Medicine | Admitting: Emergency Medicine

## 2014-12-01 ENCOUNTER — Encounter (HOSPITAL_COMMUNITY): Payer: Self-pay | Admitting: Emergency Medicine

## 2014-12-01 DIAGNOSIS — E079 Disorder of thyroid, unspecified: Secondary | ICD-10-CM | POA: Insufficient documentation

## 2014-12-01 DIAGNOSIS — S299XXA Unspecified injury of thorax, initial encounter: Secondary | ICD-10-CM | POA: Diagnosis not present

## 2014-12-01 DIAGNOSIS — Y998 Other external cause status: Secondary | ICD-10-CM | POA: Diagnosis not present

## 2014-12-01 DIAGNOSIS — M25511 Pain in right shoulder: Secondary | ICD-10-CM | POA: Diagnosis not present

## 2014-12-01 DIAGNOSIS — Y9389 Activity, other specified: Secondary | ICD-10-CM | POA: Diagnosis not present

## 2014-12-01 DIAGNOSIS — W1830XA Fall on same level, unspecified, initial encounter: Secondary | ICD-10-CM | POA: Insufficient documentation

## 2014-12-01 DIAGNOSIS — Z79899 Other long term (current) drug therapy: Secondary | ICD-10-CM | POA: Diagnosis not present

## 2014-12-01 DIAGNOSIS — W19XXXA Unspecified fall, initial encounter: Secondary | ICD-10-CM

## 2014-12-01 DIAGNOSIS — I10 Essential (primary) hypertension: Secondary | ICD-10-CM | POA: Diagnosis not present

## 2014-12-01 DIAGNOSIS — S42201A Unspecified fracture of upper end of right humerus, initial encounter for closed fracture: Secondary | ICD-10-CM | POA: Diagnosis not present

## 2014-12-01 DIAGNOSIS — Z7982 Long term (current) use of aspirin: Secondary | ICD-10-CM | POA: Diagnosis not present

## 2014-12-01 DIAGNOSIS — Y9289 Other specified places as the place of occurrence of the external cause: Secondary | ICD-10-CM | POA: Insufficient documentation

## 2014-12-01 DIAGNOSIS — S42291A Other displaced fracture of upper end of right humerus, initial encounter for closed fracture: Secondary | ICD-10-CM | POA: Diagnosis not present

## 2014-12-01 DIAGNOSIS — S4991XA Unspecified injury of right shoulder and upper arm, initial encounter: Secondary | ICD-10-CM | POA: Diagnosis present

## 2014-12-01 HISTORY — DX: Disorder of thyroid, unspecified: E07.9

## 2014-12-01 HISTORY — DX: Other displaced fracture of upper end of right humerus, initial encounter for closed fracture: S42.291A

## 2014-12-01 HISTORY — DX: Essential (primary) hypertension: I10

## 2014-12-01 LAB — COMPREHENSIVE METABOLIC PANEL
ALK PHOS: 59 U/L (ref 39–117)
ALT: 20 U/L (ref 0–35)
ANION GAP: 11 (ref 5–15)
AST: 26 U/L (ref 0–37)
Albumin: 4.3 g/dL (ref 3.5–5.2)
BUN: 20 mg/dL (ref 6–23)
CALCIUM: 9.5 mg/dL (ref 8.4–10.5)
CO2: 24 mmol/L (ref 19–32)
CREATININE: 0.85 mg/dL (ref 0.50–1.10)
Chloride: 105 mmol/L (ref 96–112)
GFR calc Af Amer: 68 mL/min — ABNORMAL LOW (ref >90)
GFR, EST NON AFRICAN AMERICAN: 59 mL/min — AB (ref >90)
Glucose, Bld: 156 mg/dL — ABNORMAL HIGH (ref 70–99)
POTASSIUM: 3.8 mmol/L (ref 3.5–5.1)
Sodium: 140 mmol/L (ref 135–145)
Total Bilirubin: 0.9 mg/dL (ref 0.3–1.2)
Total Protein: 6.9 g/dL (ref 6.0–8.3)

## 2014-12-01 LAB — CBC WITH DIFFERENTIAL/PLATELET
Basophils Absolute: 0 10*3/uL (ref 0.0–0.1)
Basophils Relative: 0 % (ref 0–1)
EOS ABS: 0 10*3/uL (ref 0.0–0.7)
EOS PCT: 0 % (ref 0–5)
HCT: 39.8 % (ref 36.0–46.0)
Hemoglobin: 12.8 g/dL (ref 12.0–15.0)
LYMPHS ABS: 0.9 10*3/uL (ref 0.7–4.0)
Lymphocytes Relative: 6 % — ABNORMAL LOW (ref 12–46)
MCH: 30.4 pg (ref 26.0–34.0)
MCHC: 32.2 g/dL (ref 30.0–36.0)
MCV: 94.5 fL (ref 78.0–100.0)
Monocytes Absolute: 0.9 10*3/uL (ref 0.1–1.0)
Monocytes Relative: 6 % (ref 3–12)
NEUTROS ABS: 11.5 10*3/uL — AB (ref 1.7–7.7)
Neutrophils Relative %: 88 % — ABNORMAL HIGH (ref 43–77)
Platelets: 133 10*3/uL — ABNORMAL LOW (ref 150–400)
RBC: 4.21 MIL/uL (ref 3.87–5.11)
RDW: 14 % (ref 11.5–15.5)
WBC: 13.2 10*3/uL — ABNORMAL HIGH (ref 4.0–10.5)

## 2014-12-01 MED ORDER — ACETAMINOPHEN 325 MG PO TABS
650.0000 mg | ORAL_TABLET | Freq: Once | ORAL | Status: AC
  Administered 2014-12-01: 650 mg via ORAL
  Filled 2014-12-01: qty 2
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
## 2014-12-01 NOTE — ED Provider Notes (Signed)
Medical screening examination/treatment/procedure(s) were conducted as a shared visit with non-physician practitioner(s) and myself.  I personally evaluated the patient during the encounter.   EKG Interpretation   Date/Time:  Thursday December 01 2014 22:08:30 EST Ventricular Rate:  80 PR Interval:  121 QRS Duration: 125 QT Interval:  393 QTC Calculation: 453 R Axis:   18 Text Interpretation:  Sinus rhythm Right bundle branch block No  significant change since last tracing Confirmed by Mel Tadros  MD, Riyan Haile  ([id number]) on 12/01/2014 10:39:42 PM     Pt here after mechanical fall--fracture/dislocation noted, ortho to see and admit to medicine  Leota Jacobsen, MD 12/01/14 2241
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
## 2014-12-01 NOTE — Progress Notes (Signed)
  CARE MANAGEMENT ED NOTE 12/01/2014  Patient:  Emily Spence, Emily Spence   Account Number:  [id number]  Date Initiated:  12/01/2014  Documentation initiated by:  Livia Snellen  Subjective/Objective Assessment:   Patient presents to Ed with post fall with injury to right arm     Subjective/Objective Assessment Detail:   Patient with pmhx of HTN anf thyroid disease.     Action/Plan:   Action/Plan Detail:   Anticipated DC Date:       Status Recommendation to Physician:   Result of Recommendation:    Other ED Goff  Other  PCP issues    Choice offered to / List presented to:            Status of service:  Completed, signed off  ED Comments:   ED Comments Detail:  EDCM spoke to patient at bedside.  Patien tis very hard of hearing.  Patien tconfirms her pcp is Dr. Lajean Manes. system updated.  Patient is from [facility].

## 2014-12-01 NOTE — ED Provider Notes (Signed)
CSN: [id number]     Arrival date & time 12/01/14  1740 History   First MD Initiated Contact with Patient 12/01/14 2032     Chief Complaint  Patient presents with  . Fall  . Shoulder Pain    right     (Consider location/radiation/quality/duration/timing/severity/associated sxs/prior Treatment) HPI Comments: Patient with history of hypertension, hypothyroidism -- presents with complaint of fall and right shoulder pain just prior to arrival. Patient was pushing a cart when the cart broke and she fell onto her right shoulder. She did not hit her head. She denies neck pain and hip pain. She denies other injury. No blood thinners. Patient is at her baseline per family at bedside. No nausea, vomiting, diarrhea. No abdominal pain, chest pain or shortness of breath. Patient took Tylenol prior to arrival. The onset of this condition was acute. The course is constant. Aggravating factors: movement. Alleviating factors: none.    Patient is a 81 y.o. female presenting with fall and shoulder pain. The history is provided by the patient.  Fall Associated symptoms include arthralgias and myalgias. Pertinent negatives include no abdominal pain, chest pain, coughing, headaches, joint swelling, nausea, neck pain, numbness, rash, sore throat, vomiting or weakness.  Shoulder Pain Associated symptoms: no back pain and no neck pain     Past Medical History  Diagnosis Date  . Hypertension   . Thyroid disease    Past Surgical History  Procedure Laterality Date  . Appendectomy    . Tonsillectomy     No family history on file. History  Substance Use Topics  . Smoking status: Never Smoker   . Smokeless tobacco: Not on file  . Alcohol Use: No   OB History    No data available     Review of Systems  Constitutional: Negative for activity change.  HENT: Negative for rhinorrhea and sore throat.   Eyes: Negative for redness.  Respiratory: Negative for cough.   Cardiovascular: Negative for chest pain.   Gastrointestinal: Negative for nausea, vomiting, abdominal pain and diarrhea.  Genitourinary: Negative for dysuria.  Musculoskeletal: Positive for myalgias and arthralgias. Negative for back pain, joint swelling and neck pain.  Skin: Negative for rash and wound.  Neurological: Negative for weakness, numbness and headaches.    Allergies  Simvastatin and Sulfur  Home Medications   Prior to Admission medications   Medication Sig Start Date End Date Taking? Authorizing Provider  acetaminophen (TYLENOL) 500 MG tablet Take 1,000 mg by mouth every 6 (six) hours as needed for mild pain.   Yes Historical Provider, MD  amLODipine (NORVASC) 2.5 MG tablet Take 2.5 mg by mouth daily.   Yes Historical Provider, MD  aspirin 81 MG chewable tablet Chew 81 mg by mouth daily.   Yes Historical Provider, MD  levothyroxine (SYNTHROID, LEVOTHROID) 50 MCG tablet Take 50 mcg by mouth See admin instructions. 1 tab daily except 1&1/2 tab on Tues and Thurs   Yes Historical Provider, MD  sodium chloride (OCEAN) 0.65 % SOLN nasal spray Place 1 spray into both nostrils as needed for congestion.   Yes Historical Provider, MD  Zinc Gluconate 13.3 MG LOZG Use as directed 1 lozenge in the mouth or throat daily as needed (cold symptoms).   Yes Historical Provider, MD   BP 159/74 mmHg  Pulse 77  Temp(Src) 98 F (36.7 C) (Oral)  Resp 17  SpO2 90%  Physical Exam  Constitutional: She appears well-developed and well-nourished.  HENT:  Head: Normocephalic and atraumatic.  Eyes: Conjunctivae are  normal. Pupils are equal, round, and reactive to light. Right eye exhibits no discharge. Left eye exhibits no discharge.  Neck: Normal range of motion. Neck supple.  Cardiovascular: Normal rate, regular rhythm and normal heart sounds.  Exam reveals no decreased pulses.   No murmur heard. Pulses:      Radial pulses are 2+ on the right side, and 2+ on the left side.  Pulmonary/Chest: Effort normal and breath sounds normal. No  respiratory distress. She has no wheezes. She has no rales.  Abdominal: Soft. There is no tenderness. There is no rebound and no guarding.  Musculoskeletal: She exhibits tenderness. She exhibits no edema.       Right shoulder: She exhibits decreased range of motion, tenderness, bony tenderness, deformity and decreased strength.       Right elbow: Normal.      Right wrist: Normal.       Right upper arm: She exhibits tenderness, bony tenderness, swelling and deformity.  Neurological: She is alert. No sensory deficit.  Motor, sensation, and vascular distal to the injury is fully intact.   Skin: Skin is warm and dry.  Psychiatric: She has a normal mood and affect.  Nursing note and vitals reviewed.   ED Course  Procedures (including critical care time) Labs Review Labs Reviewed - No data to display  Imaging Review Dg Chest 1 View  12/01/2014   CLINICAL DATA:  Status post fall; known right humerus fracture. Assess for chest injury. Initial encounter.  EXAM: CHEST  1 VIEW  COMPARISON:  Right shoulder radiographs performed earlier today at 8:31 p.m.  FINDINGS: The lungs are well-aerated. Two nonspecific nodular densities are seen at the left mid lung zone, measuring 6 mm and 8 mm. Minimal left basilar atelectasis is noted. There is no evidence of pleural effusion or pneumothorax.  The cardiomediastinal silhouette is borderline normal in size. No displaced rib fractures are seen. There is a comminuted fracture involving the right humeral head and neck, with anterior-inferior dislocation of the right humeral head.  IMPRESSION: 1. No displaced rib fracture seen. 2. Comminuted fracture involving the right humeral head and neck, with anterior-inferior dislocation of the right humeral head. 3. Nonspecific nodular densities at the left midlung zone, measuring up to 8 mm. These could be further assessed on an elective nonemergent basis, if deemed clinically appropriate. Minimal left basilar atelectasis seen.    Electronically Signed   By: Garald Balding M.D.   On: 12/01/2014 22:15   Dg Shoulder Right  12/01/2014   CLINICAL DATA:  Acute right shoulder pain after falling off bus. Initial encounter.  EXAM: RIGHT SHOULDER - 2+ VIEW  COMPARISON:  None.  FINDINGS: Severely comminuted and displaced fracture is seen involving the proximal right humeral head and neck. There is anterior dislocation noted as well.  IMPRESSION: Severely comminuted and displaced proximal right humeral head and neck fracture with anterior dislocation.   Electronically Signed   By: Sabino Dick M.D.   On: 12/01/2014 20:49   Ct Shoulder Right Wo Contrast  12/01/2014   CLINICAL DATA:  81 year old female with right proximal humerus fracture. Pain after fall.  EXAM: CT OF THE RIGHT SHOULDER WITHOUT CONTRAST  TECHNIQUE: Multidetector CT imaging was performed according to the standard protocol. Multiplanar CT image reconstructions were also generated.  COMPARISON:  Radiographs earlier this day.  FINDINGS: Highly comminuted impacted proximal humerus fracture involving the humeral head and neck. Displacement and dislocation of the main humeral head fracture fragment inferiorly and perched on the  inferior glenoid. There is a rotational component of the humeral head fracture fragment posteriorly. There is displacement of the greater tuberosity of 1.5 cm. Fracture involvement of the lesser tuberosity and bicipital groove. No bony glenoid or scapula fracture. The acromioclavicular joint remains congruent. There is an associated lipohemarthrosis. Diffuse soft tissue edema most confluent posteriorly.  IMPRESSION: Highly comminuted impacted proximal humeral head/ neck fracture with inferior displacement and dislocation of the humeral head, perched on the inferior glenoid. There is involvement of the greater and lesser tuberosities and bicipital groove with fracture fragment displacement greater than 1 cm.   Electronically Signed   By: Jeb Levering M.D.   On:  12/01/2014 22:43     EKG Interpretation   Date/Time:  Thursday December 01 2014 22:08:30 EST Ventricular Rate:  80 PR Interval:  121 QRS Duration: 125 QT Interval:  393 QTC Calculation: 453 R Axis:   18 Text Interpretation:  Sinus rhythm Right bundle branch block No  significant change since last tracing Confirmed by ALLEN  MD, ANTHONY  ([id number]) on 12/01/2014 10:39:42 PM       9:10 PM Patient seen and examined. Work-up initiated. Medications ordered.   Vital signs reviewed and are as follows: BP 159/74 mmHg  Pulse 77  Temp(Src) 98 F (36.7 C) (Oral)  Resp 17  SpO2 90%  9:14 PM Discussed with Dr. Zenia Resides. Will speak with orthopedics.   9:33 PM Spoke with Dr. Mardelle Matte. Reccs: CT shoulder, pre-op labs and imaging. He will admit. Would like patient transferred to [facility].   11:19 PM CT performed. Dr. Mardelle Matte has seen. Patient has decided not to have surgery. She elects to follow-up in Dr. Luanna Cole office in 1 week. She will be managed at home on Tylenol. Will discharge.   MDM   Final diagnoses:  Closed 4-part fracture of proximal end of right humerus, initial encounter   Patient with fracture as above. She elects not to have surgery and will not be admitted. Will d/c to home.     Carlisle Cater, PA-C 12/01/14 2323  Leota Jacobsen, MD 12/05/14 [phone]

## 2014-12-01 NOTE — Discharge Instructions (Signed)
Please read and follow all provided instructions.  Your diagnoses today include:  1. Closed 4-part fracture of proximal end of right humerus, initial encounter   2. Fall    Tests performed today include:  An x-ray of the affected area - shows broken arm and dislocated arm  Vital signs. See below for your results today.   Medications prescribed:   None  Take any prescribed medications only as directed.  Home care instructions:   Follow any educational materials contained in this packet  Follow R.I.C.E. Protocol:  R - rest your injury   I  - use ice on injury without applying directly to skin  C - compress injury with bandage or splint  E - elevate the injury as much as possible  Follow-up instructions: Please follow-up with Dr. Mardelle Matte as planned.    Return instructions:   Please return if your fingers are numb or tingling, appear gray or blue, or you have severe pain (also elevate the arm and loosen splint or wrap if you were given one)  Please return to the Emergency Department if you experience worsening symptoms.   Please return if you have any other emergent concerns.  Additional Information:  Your vital signs today were: BP 159/74 mmHg   Pulse 77   Temp(Src) 98 F (36.7 C) (Oral)   Resp 17   SpO2 90% If your blood pressure (BP) was elevated above 135/85 this visit, please have this repeated by your doctor within one month. --------------

## 2014-12-01 NOTE — Progress Notes (Signed)
CSW met with patient at bedside. Family was present. Physician was who states that patient has a broken bone.   Family confirms that the patient presents to the ED tonight due to falling. Family also confirms that the patient is from [facility], and has been living there since 2000.  Family states that prior to coming into [facility] the patient has been able to complete ADL's independently.   Willette Brace [phone] ED CSW 12/01/2014 9:07 PM

## 2014-12-02 SURGERY — ARTHROPLASTY, SHOULDER, TOTAL
Anesthesia: General | Laterality: Right

## 2014-12-05 DIAGNOSIS — S42201A Unspecified fracture of upper end of right humerus, initial encounter for closed fracture: Secondary | ICD-10-CM | POA: Diagnosis not present

## 2014-12-07 ENCOUNTER — Encounter (HOSPITAL_COMMUNITY): Payer: Self-pay

## 2014-12-07 ENCOUNTER — Encounter (HOSPITAL_COMMUNITY)
Admission: RE | Admit: 2014-12-07 | Discharge: 2014-12-07 | Disposition: A | Discharge status: Home / Self Care | Payer: Medicare Other | Source: Ambulatory Visit | Attending: Orthopedic Surgery | Admitting: Orthopedic Surgery

## 2014-12-07 DIAGNOSIS — Z79899 Other long term (current) drug therapy: Secondary | ICD-10-CM

## 2014-12-07 DIAGNOSIS — Z01812 Encounter for preprocedural laboratory examination: Secondary | ICD-10-CM

## 2014-12-07 DIAGNOSIS — X58XXXA Exposure to other specified factors, initial encounter: Secondary | ICD-10-CM

## 2014-12-07 DIAGNOSIS — S42201A Unspecified fracture of upper end of right humerus, initial encounter for closed fracture: Secondary | ICD-10-CM

## 2014-12-07 DIAGNOSIS — Z7982 Long term (current) use of aspirin: Secondary | ICD-10-CM

## 2014-12-07 DIAGNOSIS — I10 Essential (primary) hypertension: Secondary | ICD-10-CM

## 2014-12-07 DIAGNOSIS — Z87891 Personal history of nicotine dependence: Secondary | ICD-10-CM

## 2014-12-07 DIAGNOSIS — E039 Hypothyroidism, unspecified: Secondary | ICD-10-CM | POA: Insufficient documentation

## 2014-12-07 HISTORY — DX: Hypothyroidism, unspecified: E03.9

## 2014-12-07 LAB — CBC
HEMATOCRIT: 30.5 % — AB (ref 36.0–46.0)
HEMOGLOBIN: 10.1 g/dL — AB (ref 12.0–15.0)
MCH: 30.3 pg (ref 26.0–34.0)
MCHC: 33.1 g/dL (ref 30.0–36.0)
MCV: 91.6 fL (ref 78.0–100.0)
Platelets: 180 10*3/uL (ref 150–400)
RBC: 3.33 MIL/uL — ABNORMAL LOW (ref 3.87–5.11)
RDW: 14.5 % (ref 11.5–15.5)
WBC: 9.3 10*3/uL (ref 4.0–10.5)

## 2014-12-07 LAB — BASIC METABOLIC PANEL
ANION GAP: 12 (ref 5–15)
BUN: 13 mg/dL (ref 6–23)
CHLORIDE: 104 mmol/L (ref 96–112)
CO2: 23 mmol/L (ref 19–32)
Calcium: 9 mg/dL (ref 8.4–10.5)
Creatinine, Ser: 0.7 mg/dL (ref 0.50–1.10)
GFR calc Af Amer: 86 mL/min — ABNORMAL LOW (ref >90)
GFR calc non Af Amer: 74 mL/min — ABNORMAL LOW (ref >90)
GLUCOSE: 103 mg/dL — AB (ref 70–99)
Potassium: 4 mmol/L (ref 3.5–5.1)
Sodium: 139 mmol/L (ref 135–145)

## 2014-12-07 LAB — SURGICAL PCR SCREEN
MRSA, PCR: NEGATIVE
STAPHYLOCOCCUS AUREUS: NEGATIVE

## 2014-12-07 NOTE — Progress Notes (Addendum)
ASKED ALLISON ZELENAK  WITH ANESTHESIA TO REVIEW CXR DONE 12/01/14.  PATIENT DENIES ANY SOB OF DIFFICULTY BREATHING. PER ALLISON WE DID NOT NEED ANOTHER CXR AT PAT.

## 2014-12-07 NOTE — Progress Notes (Signed)
SPOKE WITH SHARRY AT DR. LANDAU'S OFFICE RE; SURGERY IS ON RIGHT SHOULDER BUT REASON SAYS BROKEN LEFT SHOULDER.

## 2014-12-07 NOTE — Pre-Procedure Instructions (Signed)
Emily Spence  12/07/2014   Your procedure is scheduled on:  Friday  12/09/14  Report to [facility] Admitting at 530 AM.  Call this number if you have problems the morning of surgery: [phone]   Remember:   Do not eat food or drink liquids after midnight.   Take these medicines the morning of surgery with A SIP OF WATER:  TYLENOL IF NEEDED, AMLODIPINE (NORVASC), LEVOTHYROXINE (SYNTHROID).                            STOP  ASPIRIN !!!!!!!!   Do not wear jewelry, make-up or nail polish.  Do not wear lotions, powders, or perfumes. You may wear deodorant.  Do not shave 48 hours prior to surgery. Men may shave face and neck.  Do not bring valuables to the hospital.  [facility] is not responsible                  for any belongings or valuables.               Contacts, dentures or bridgework may not be worn into surgery.  Leave suitcase in the car. After surgery it may be brought to your room.  For patients admitted to the hospital, discharge time is determined by your                treatment team.               Patients discharged the day of surgery will not be allowed to drive  home.  Name and phone number of your driver:  Special Instructions: [facility] - Preparing for Surgery  Before surgery, you can play an important role.  Because skin is not sterile, your skin needs to be as free of germs as possible.  You can reduce the number of germs on you skin by washing with CHG (chlorahexidine gluconate) soap before surgery.  CHG is an antiseptic cleaner which kills germs and bonds with the skin to continue killing germs even after washing.  Please DO NOT use if you have an allergy to CHG or antibacterial soaps.  If your skin becomes reddened/irritated stop using the CHG and inform your nurse when you arrive at Short Stay.  Do not shave (including legs and underarms) for at least 48 hours prior to the first CHG shower.  You may shave your face.  Please follow these  instructions carefully:   1.  Shower with CHG Soap the night before surgery and the                                morning of Surgery.  2.  If you choose to wash your hair, wash your hair first as usual with your       normal shampoo.  3.  After you shampoo, rinse your hair and body thoroughly to remove the                      Shampoo.  4.  Use CHG as you would any other liquid soap.  You can apply chg directly       to the skin and wash gently with scrungie or a clean washcloth.  5.  Apply the CHG Soap to your body ONLY FROM THE NECK DOWN.        Do not use on  open wounds or open sores.  Avoid contact with your eyes,       ears, mouth and genitals (private parts).  Wash genitals (private parts)       with your normal soap.  6.  Wash thoroughly, paying special attention to the area where your surgery        will be performed.  7.  Thoroughly rinse your body with warm water from the neck down.  8.  DO NOT shower/wash with your normal soap after using and rinsing off       the CHG Soap.  9.  Pat yourself dry with a clean towel.            10.  Wear clean pajamas.            11.  Place clean sheets on your bed the night of your first shower and do not        sleep with pets.  Day of Surgery  Do not apply any lotions/deoderants the morning of surgery.  Please wear clean clothes to the hospital/surgery center.     Please read over the following fact sheets that you were given: Pain Booklet, Coughing and Deep Breathing, MRSA Information and Surgical Site Infection Prevention

## 2014-12-08 MED ORDER — CEFAZOLIN SODIUM-DEXTROSE 2-3 GM-% IV SOLR
2.0000 g | INTRAVENOUS | Status: AC
  Administered 2014-12-09: 2 g via INTRAVENOUS
  Filled 2014-12-08: qty 50

## 2014-12-09 ENCOUNTER — Inpatient Hospital Stay (HOSPITAL_COMMUNITY): Payer: Medicare Other

## 2014-12-09 ENCOUNTER — Inpatient Hospital Stay (HOSPITAL_COMMUNITY): Payer: Medicare Other | Admitting: Vascular Surgery

## 2014-12-09 ENCOUNTER — Encounter (HOSPITAL_COMMUNITY): Payer: Self-pay | Admitting: *Deleted

## 2014-12-09 ENCOUNTER — Inpatient Hospital Stay (HOSPITAL_COMMUNITY)
Admission: RE | Admit: 2014-12-09 | Discharge: 2014-12-12 | DRG: 483 | Disposition: A | Discharge status: Skilled Nursing Facility | Payer: Medicare Other | Source: Ambulatory Visit | Attending: Orthopedic Surgery | Admitting: Orthopedic Surgery

## 2014-12-09 ENCOUNTER — Inpatient Hospital Stay (HOSPITAL_COMMUNITY): Payer: Medicare Other | Admitting: Anesthesiology

## 2014-12-09 ENCOUNTER — Encounter (HOSPITAL_COMMUNITY)
Admission: RE | Disposition: A | Discharge status: Skilled Nursing Facility | Payer: Self-pay | Source: Ambulatory Visit | Attending: Orthopedic Surgery

## 2014-12-09 DIAGNOSIS — Z87891 Personal history of nicotine dependence: Secondary | ICD-10-CM

## 2014-12-09 DIAGNOSIS — R262 Difficulty in walking, not elsewhere classified: Secondary | ICD-10-CM | POA: Diagnosis present

## 2014-12-09 DIAGNOSIS — S42201A Unspecified fracture of upper end of right humerus, initial encounter for closed fracture: Secondary | ICD-10-CM | POA: Diagnosis not present

## 2014-12-09 DIAGNOSIS — E039 Hypothyroidism, unspecified: Secondary | ICD-10-CM | POA: Diagnosis not present

## 2014-12-09 DIAGNOSIS — Z79899 Other long term (current) drug therapy: Secondary | ICD-10-CM

## 2014-12-09 DIAGNOSIS — Z9842 Cataract extraction status, left eye: Secondary | ICD-10-CM | POA: Diagnosis not present

## 2014-12-09 DIAGNOSIS — Y92811 Bus as the place of occurrence of the external cause: Secondary | ICD-10-CM | POA: Diagnosis not present

## 2014-12-09 DIAGNOSIS — M6281 Muscle weakness (generalized): Secondary | ICD-10-CM | POA: Diagnosis not present

## 2014-12-09 DIAGNOSIS — Z888 Allergy status to other drugs, medicaments and biological substances status: Secondary | ICD-10-CM | POA: Diagnosis not present

## 2014-12-09 DIAGNOSIS — G8918 Other acute postprocedural pain: Secondary | ICD-10-CM | POA: Diagnosis not present

## 2014-12-09 DIAGNOSIS — W19XXXD Unspecified fall, subsequent encounter: Secondary | ICD-10-CM | POA: Diagnosis not present

## 2014-12-09 DIAGNOSIS — Z961 Presence of intraocular lens: Secondary | ICD-10-CM | POA: Diagnosis present

## 2014-12-09 DIAGNOSIS — M79601 Pain in right arm: Secondary | ICD-10-CM | POA: Diagnosis not present

## 2014-12-09 DIAGNOSIS — S42291A Other displaced fracture of upper end of right humerus, initial encounter for closed fracture: Secondary | ICD-10-CM | POA: Diagnosis not present

## 2014-12-09 DIAGNOSIS — G8929 Other chronic pain: Secondary | ICD-10-CM | POA: Diagnosis not present

## 2014-12-09 DIAGNOSIS — S42301A Unspecified fracture of shaft of humerus, right arm, initial encounter for closed fracture: Secondary | ICD-10-CM | POA: Diagnosis not present

## 2014-12-09 DIAGNOSIS — I10 Essential (primary) hypertension: Secondary | ICD-10-CM | POA: Diagnosis not present

## 2014-12-09 DIAGNOSIS — E079 Disorder of thyroid, unspecified: Secondary | ICD-10-CM | POA: Diagnosis not present

## 2014-12-09 DIAGNOSIS — K5900 Constipation, unspecified: Secondary | ICD-10-CM | POA: Diagnosis not present

## 2014-12-09 DIAGNOSIS — R531 Weakness: Secondary | ICD-10-CM | POA: Diagnosis present

## 2014-12-09 DIAGNOSIS — S42201D Unspecified fracture of upper end of right humerus, subsequent encounter for fracture with routine healing: Secondary | ICD-10-CM | POA: Diagnosis not present

## 2014-12-09 DIAGNOSIS — M19011 Primary osteoarthritis, right shoulder: Secondary | ICD-10-CM | POA: Diagnosis not present

## 2014-12-09 DIAGNOSIS — Z9841 Cataract extraction status, right eye: Secondary | ICD-10-CM | POA: Diagnosis not present

## 2014-12-09 DIAGNOSIS — Z7982 Long term (current) use of aspirin: Secondary | ICD-10-CM | POA: Diagnosis not present

## 2014-12-09 DIAGNOSIS — R279 Unspecified lack of coordination: Secondary | ICD-10-CM | POA: Diagnosis not present

## 2014-12-09 DIAGNOSIS — Y929 Unspecified place or not applicable: Secondary | ICD-10-CM | POA: Diagnosis not present

## 2014-12-09 DIAGNOSIS — W19XXXA Unspecified fall, initial encounter: Secondary | ICD-10-CM | POA: Diagnosis present

## 2014-12-09 DIAGNOSIS — M25511 Pain in right shoulder: Secondary | ICD-10-CM | POA: Diagnosis present

## 2014-12-09 HISTORY — PX: REVERSE SHOULDER ARTHROPLASTY: SHX5054

## 2014-12-09 SURGERY — ARTHROPLASTY, SHOULDER, TOTAL, REVERSE
Anesthesia: General | Site: Shoulder | Laterality: Right

## 2014-12-09 MED ORDER — PROPOFOL 10 MG/ML IV BOLUS
INTRAVENOUS | Status: DC | PRN
Start: 2014-12-09 — End: 2014-12-09
  Administered 2014-12-09: 50 mg via INTRAVENOUS

## 2014-12-09 MED ORDER — SUCCINYLCHOLINE CHLORIDE 20 MG/ML IJ SOLN
INTRAMUSCULAR | Status: DC | PRN
  Administered 2014-12-09: 180 mg via INTRAVENOUS

## 2014-12-09 MED ORDER — BACLOFEN 10 MG PO TABS: 10.0000 mg | ORAL_TABLET | Freq: Three times a day (TID) | ORAL | Status: DC

## 2014-12-09 MED ORDER — POLYETHYLENE GLYCOL 3350 17 G PO PACK: 17.0000 g | PACK | Freq: Every day | ORAL | Status: DC | PRN

## 2014-12-09 MED ORDER — ONDANSETRON HCL 4 MG/2ML IJ SOLN
INTRAMUSCULAR | Status: DC | PRN
  Administered 2014-12-09: 4 mg via INTRAVENOUS

## 2014-12-09 MED ORDER — AMLODIPINE BESYLATE 2.5 MG PO TABS
2.5000 mg | ORAL_TABLET | Freq: Every day | ORAL | Status: DC
  Administered 2014-12-10 – 2014-12-12 (×3): 2.5 mg via ORAL
  Filled 2014-12-09 (×4): qty 1

## 2014-12-09 MED ORDER — MORPHINE SULFATE 2 MG/ML IJ SOLN: 0.5000 mg | INTRAMUSCULAR | Status: DC | PRN

## 2014-12-09 MED ORDER — METOCLOPRAMIDE HCL 5 MG/ML IJ SOLN: 5.0000 mg | Freq: Three times a day (TID) | INTRAMUSCULAR | Status: DC | PRN

## 2014-12-09 MED ORDER — ROCURONIUM BROMIDE 50 MG/5ML IV SOLN
INTRAVENOUS | Status: AC
  Filled 2014-12-09: qty 1

## 2014-12-09 MED ORDER — SENNA-DOCUSATE SODIUM 8.6-50 MG PO TABS: 2.0000 | ORAL_TABLET | Freq: Every day | ORAL | Status: DC

## 2014-12-09 MED ORDER — HYDROCODONE-ACETAMINOPHEN 5-325 MG PO TABS: 1.0000 | ORAL_TABLET | Freq: Four times a day (QID) | ORAL | Status: DC | PRN

## 2014-12-09 MED ORDER — LACTATED RINGERS IV SOLN
INTRAVENOUS | Status: DC | PRN
  Administered 2014-12-09 (×2): via INTRAVENOUS

## 2014-12-09 MED ORDER — METHOCARBAMOL 1000 MG/10ML IJ SOLN
500.0000 mg | INTRAVENOUS | Status: AC
  Administered 2014-12-09: 500 mg via INTRAVENOUS
  Filled 2014-12-09: qty 5

## 2014-12-09 MED ORDER — 0.9 % SODIUM CHLORIDE (POUR BTL) OPTIME
TOPICAL | Status: DC | PRN
  Administered 2014-12-09: 1000 mL

## 2014-12-09 MED ORDER — SUCCINYLCHOLINE CHLORIDE 20 MG/ML IJ SOLN
INTRAMUSCULAR | Status: AC
  Filled 2014-12-09: qty 1

## 2014-12-09 MED ORDER — HYDROCODONE-ACETAMINOPHEN 5-325 MG PO TABS
ORAL_TABLET | ORAL | Status: AC
  Administered 2014-12-09: 2
  Filled 2014-12-09: qty 2

## 2014-12-09 MED ORDER — LEVOTHYROXINE SODIUM 50 MCG PO TABS: 50.0000 ug | ORAL_TABLET | ORAL | Status: DC

## 2014-12-09 MED ORDER — DOCUSATE SODIUM 100 MG PO CAPS
100.0000 mg | ORAL_CAPSULE | Freq: Two times a day (BID) | ORAL | Status: DC
  Administered 2014-12-10 – 2014-12-12 (×5): 100 mg via ORAL
  Filled 2014-12-09 (×6): qty 1

## 2014-12-09 MED ORDER — POTASSIUM CHLORIDE IN NACL 20-0.45 MEQ/L-% IV SOLN
INTRAVENOUS | Status: DC
  Filled 2014-12-09 (×7): qty 1000

## 2014-12-09 MED ORDER — KETOROLAC TROMETHAMINE 30 MG/ML IJ SOLN
INTRAMUSCULAR | Status: AC
  Administered 2014-12-09: 30 mg via INTRAVENOUS
  Filled 2014-12-09: qty 1

## 2014-12-09 MED ORDER — SODIUM CHLORIDE 0.9 % IV SOLN
10.0000 mg | INTRAVENOUS | Status: DC | PRN
  Administered 2014-12-09: 15 ug/min via INTRAVENOUS

## 2014-12-09 MED ORDER — ARTIFICIAL TEARS OP OINT
TOPICAL_OINTMENT | OPHTHALMIC | Status: AC
  Filled 2014-12-09: qty 3.5

## 2014-12-09 MED ORDER — FENTANYL CITRATE 0.05 MG/ML IJ SOLN: 25.0000 ug | INTRAMUSCULAR | Status: DC | PRN

## 2014-12-09 MED ORDER — BISACODYL 10 MG RE SUPP: 10.0000 mg | Freq: Every day | RECTAL | Status: DC | PRN

## 2014-12-09 MED ORDER — ONDANSETRON HCL 4 MG/2ML IJ SOLN
INTRAMUSCULAR | Status: AC
  Filled 2014-12-09: qty 2

## 2014-12-09 MED ORDER — SODIUM CHLORIDE 0.9 % IJ SOLN
INTRAMUSCULAR | Status: AC
  Filled 2014-12-09: qty 10

## 2014-12-09 MED ORDER — PROPOFOL 10 MG/ML IV BOLUS
INTRAVENOUS | Status: AC
  Filled 2014-12-09: qty 20

## 2014-12-09 MED ORDER — EPHEDRINE SULFATE 50 MG/ML IJ SOLN
INTRAMUSCULAR | Status: DC | PRN
  Administered 2014-12-09: 10 mg via INTRAVENOUS
  Administered 2014-12-09: 5 mg via INTRAVENOUS

## 2014-12-09 MED ORDER — HYDROCODONE-ACETAMINOPHEN 10-325 MG PO TABS
1.0000 | ORAL_TABLET | ORAL | Status: DC | PRN
  Administered 2014-12-09 – 2014-12-10 (×4): 1 via ORAL
  Filled 2014-12-09 (×4): qty 1

## 2014-12-09 MED ORDER — PHENOL 1.4 % MT LIQD: 1.0000 | OROMUCOSAL | Status: DC | PRN

## 2014-12-09 MED ORDER — METHOCARBAMOL 1000 MG/10ML IJ SOLN
500.0000 mg | Freq: Four times a day (QID) | INTRAVENOUS | Status: DC | PRN
  Filled 2014-12-09: qty 5

## 2014-12-09 MED ORDER — KETOROLAC TROMETHAMINE 30 MG/ML IJ SOLN
30.0000 mg | Freq: Once | INTRAMUSCULAR | Status: AC | PRN
  Administered 2014-12-09: 30 mg via INTRAVENOUS

## 2014-12-09 MED ORDER — ROPIVACAINE HCL 5 MG/ML IJ SOLN
INTRAMUSCULAR | Status: DC | PRN
Start: 2014-12-09 — End: 2014-12-09
  Administered 2014-12-09: 20 mL via PERINEURAL

## 2014-12-09 MED ORDER — SENNA 8.6 MG PO TABS
1.0000 | ORAL_TABLET | Freq: Two times a day (BID) | ORAL | Status: DC
  Administered 2014-12-09 – 2014-12-12 (×6): 8.6 mg via ORAL
  Filled 2014-12-09 (×7): qty 1

## 2014-12-09 MED ORDER — METOCLOPRAMIDE HCL 10 MG PO TABS: 5.0000 mg | ORAL_TABLET | Freq: Three times a day (TID) | ORAL | Status: DC | PRN

## 2014-12-09 MED ORDER — SALINE SPRAY 0.65 % NA SOLN
1.0000 | NASAL | Status: DC | PRN
  Filled 2014-12-09: qty 44

## 2014-12-09 MED ORDER — PROMETHAZINE HCL 25 MG/ML IJ SOLN: 6.2500 mg | INTRAMUSCULAR | Status: DC | PRN

## 2014-12-09 MED ORDER — FENTANYL CITRATE 0.05 MG/ML IJ SOLN
INTRAMUSCULAR | Status: DC | PRN
  Administered 2014-12-09: 25 ug via INTRAVENOUS
  Administered 2014-12-09: 50 ug via INTRAVENOUS
  Administered 2014-12-09: 25 ug via INTRAVENOUS

## 2014-12-09 MED ORDER — ALUM & MAG HYDROXIDE-SIMETH 200-200-20 MG/5ML PO SUSP: 30.0000 mL | ORAL | Status: DC | PRN

## 2014-12-09 MED ORDER — LIDOCAINE HCL (CARDIAC) 20 MG/ML IV SOLN
INTRAVENOUS | Status: AC
  Filled 2014-12-09: qty 5

## 2014-12-09 MED ORDER — ONDANSETRON HCL 4 MG PO TABS: 4.0000 mg | ORAL_TABLET | Freq: Four times a day (QID) | ORAL | Status: DC | PRN

## 2014-12-09 MED ORDER — LEVOTHYROXINE SODIUM 75 MCG PO TABS
75.0000 ug | ORAL_TABLET | ORAL | Status: DC
  Filled 2014-12-09: qty 1

## 2014-12-09 MED ORDER — DIPHENHYDRAMINE HCL 12.5 MG/5ML PO ELIX: 12.5000 mg | ORAL_SOLUTION | ORAL | Status: DC | PRN

## 2014-12-09 MED ORDER — ONDANSETRON HCL 4 MG/2ML IJ SOLN: 4.0000 mg | Freq: Four times a day (QID) | INTRAMUSCULAR | Status: DC | PRN

## 2014-12-09 MED ORDER — CEFAZOLIN SODIUM 1-5 GM-% IV SOLN
1.0000 g | Freq: Four times a day (QID) | INTRAVENOUS | Status: AC
  Administered 2014-12-09 – 2014-12-10 (×3): 1 g via INTRAVENOUS
  Filled 2014-12-09 (×4): qty 50

## 2014-12-09 MED ORDER — MENTHOL 3 MG MT LOZG: 1.0000 | LOZENGE | OROMUCOSAL | Status: DC | PRN

## 2014-12-09 MED ORDER — EPHEDRINE SULFATE 50 MG/ML IJ SOLN
INTRAMUSCULAR | Status: AC
  Filled 2014-12-09: qty 1

## 2014-12-09 MED ORDER — FENTANYL CITRATE 0.05 MG/ML IJ SOLN
INTRAMUSCULAR | Status: AC
  Filled 2014-12-09: qty 5

## 2014-12-09 MED ORDER — MAGNESIUM CITRATE PO SOLN: 1.0000 | Freq: Once | ORAL | Status: AC | PRN

## 2014-12-09 MED ORDER — LIDOCAINE HCL (CARDIAC) 20 MG/ML IV SOLN
INTRAVENOUS | Status: DC | PRN
  Administered 2014-12-09: 80 mg via INTRAVENOUS

## 2014-12-09 MED ORDER — LEVOTHYROXINE SODIUM 50 MCG PO TABS
50.0000 ug | ORAL_TABLET | ORAL | Status: DC
  Administered 2014-12-10 – 2014-12-12 (×3): 50 ug via ORAL
  Filled 2014-12-09 (×3): qty 1

## 2014-12-09 MED ORDER — ACETAMINOPHEN 650 MG RE SUPP: 650.0000 mg | Freq: Four times a day (QID) | RECTAL | Status: DC | PRN

## 2014-12-09 MED ORDER — METHOCARBAMOL 500 MG PO TABS
500.0000 mg | ORAL_TABLET | Freq: Four times a day (QID) | ORAL | Status: DC | PRN
  Administered 2014-12-09 – 2014-12-10 (×2): 500 mg via ORAL
  Filled 2014-12-09 (×2): qty 1

## 2014-12-09 MED ORDER — ASPIRIN 81 MG PO CHEW
81.0000 mg | CHEWABLE_TABLET | Freq: Every day | ORAL | Status: DC
  Administered 2014-12-10 – 2014-12-12 (×3): 81 mg via ORAL
  Filled 2014-12-09 (×4): qty 1

## 2014-12-09 MED ORDER — ACETAMINOPHEN 325 MG PO TABS
650.0000 mg | ORAL_TABLET | Freq: Four times a day (QID) | ORAL | Status: DC | PRN
  Administered 2014-12-10 – 2014-12-12 (×5): 650 mg via ORAL
  Filled 2014-12-09 (×5): qty 2

## 2014-12-09 SURGICAL SUPPLY — 72 items
BENZOIN TINCTURE PRP APPL 2/3 (GAUZE/BANDAGES/DRESSINGS) IMPLANT
BLADE SAW SAG 73X25 THK (BLADE) ×2
BLADE SAW SGTL 73X25 THK (BLADE) ×1 IMPLANT
BOOTCOVER CLEANROOM LRG (PROTECTIVE WEAR) IMPLANT
BOWL SMART MIX CTS (DISPOSABLE) IMPLANT
BRUSH FEMORAL CANAL (MISCELLANEOUS) IMPLANT
CAPT SHLDR REVTOTAL 2 ×3 IMPLANT
CEMENT BONE DEPUY (Cement) ×3 IMPLANT
CLOSURE STERI-STRIP 1/2X4 (GAUZE/BANDAGES/DRESSINGS) ×1
CLSR STERI-STRIP ANTIMIC 1/2X4 (GAUZE/BANDAGES/DRESSINGS) ×2 IMPLANT
COVER SURGICAL LIGHT HANDLE (MISCELLANEOUS) ×3 IMPLANT
COVER TABLE BACK 60X90 (DRAPES) ×3 IMPLANT
DRAPE C-ARM 42X72 X-RAY (DRAPES) IMPLANT
DRAPE U-SHAPE 47X51 STRL (DRAPES) ×3 IMPLANT
DRSG MEPILEX BORDER 4X8 (GAUZE/BANDAGES/DRESSINGS) ×3 IMPLANT
DURAPREP 26ML APPLICATOR (WOUND CARE) ×3 IMPLANT
ELECT REM PT RETURN 9FT ADLT (ELECTROSURGICAL) ×3
ELECTRODE REM PT RTRN 9FT ADLT (ELECTROSURGICAL) ×1 IMPLANT
EVACUATOR 1/8 PVC DRAIN (DRAIN) IMPLANT
FACESHIELD WRAPAROUND (MASK) IMPLANT
GLOVE BIO SURGEON STRL SZ 6 (GLOVE) ×3 IMPLANT
GLOVE BIO SURGEON STRL SZ7 (GLOVE) ×3 IMPLANT
GLOVE BIOGEL PI IND STRL 6.5 (GLOVE) ×1 IMPLANT
GLOVE BIOGEL PI IND STRL 8 (GLOVE) ×1 IMPLANT
GLOVE BIOGEL PI INDICATOR 6.5 (GLOVE) ×2
GLOVE BIOGEL PI INDICATOR 8 (GLOVE) ×2
GLOVE BIOGEL PI ORTHO PRO SZ8 (GLOVE) ×2
GLOVE ECLIPSE 6.5 STRL STRAW (GLOVE) ×3 IMPLANT
GLOVE ORTHO TXT STRL SZ7.5 (GLOVE) ×3 IMPLANT
GLOVE PI ORTHO PRO STRL SZ8 (GLOVE) ×1 IMPLANT
GLOVE SURG ORTHO 8.0 STRL STRW (GLOVE) ×6 IMPLANT
GOWN STRL REUS W/ TWL LRG LVL3 (GOWN DISPOSABLE) ×2 IMPLANT
GOWN STRL REUS W/ TWL XL LVL3 (GOWN DISPOSABLE) ×1 IMPLANT
GOWN STRL REUS W/TWL 2XL LVL3 (GOWN DISPOSABLE) ×3 IMPLANT
GOWN STRL REUS W/TWL LRG LVL3 (GOWN DISPOSABLE) ×4
GOWN STRL REUS W/TWL XL LVL3 (GOWN DISPOSABLE) ×2
HANDPIECE INTERPULSE COAX TIP (DISPOSABLE)
HOOD PEEL AWAY FACE SHEILD DIS (HOOD) ×9 IMPLANT
KIT BASIN OR (CUSTOM PROCEDURE TRAY) ×3 IMPLANT
KIT ROOM TURNOVER OR (KITS) ×3 IMPLANT
MANIFOLD NEPTUNE II (INSTRUMENTS) ×3 IMPLANT
NEEDLE 1/2 CIR CATGUT .05X1.09 (NEEDLE) ×3 IMPLANT
NS IRRIG 1000ML POUR BTL (IV SOLUTION) ×3 IMPLANT
PACK SHOULDER (CUSTOM PROCEDURE TRAY) ×3 IMPLANT
PAD ARMBOARD 7.5X6 YLW CONV (MISCELLANEOUS) ×6 IMPLANT
PIN HUMERAL STMN 3.2MMX9IN (INSTRUMENTS) ×3 IMPLANT
SET HNDPC FAN SPRY TIP SCT (DISPOSABLE) IMPLANT
SLING ARM IMMOBILIZER LRG (SOFTGOODS) IMPLANT
SLING ARM IMMOBILIZER MED (SOFTGOODS) ×3 IMPLANT
SMARTMIX MINI TOWER (MISCELLANEOUS) ×3
SPONGE LAP 18X18 X RAY DECT (DISPOSABLE) IMPLANT
STRAP HEAD AND CHIN TRUMPF (MISCELLANEOUS) ×3 IMPLANT
SUCTION FRAZIER TIP 10 FR DISP (SUCTIONS) ×3 IMPLANT
SUPPORT WRAP ARM LG (MISCELLANEOUS) ×3 IMPLANT
SUT ETHIBOND 2 0 SH (SUTURE)
SUT ETHIBOND 2 0 SH 36X2 (SUTURE) IMPLANT
SUT ETHIBOND 2 OS 4 DA (SUTURE) IMPLANT
SUT ETHIBOND CT1 BRD 2-0 30IN (SUTURE) IMPLANT
SUT FIBERWIRE #2 38 REV NDL BL (SUTURE) ×15
SUT MAXBRAID (SUTURE) ×6 IMPLANT
SUT MNCRL AB 4-0 PS2 18 (SUTURE) IMPLANT
SUT VIC AB 0 CT1 27 (SUTURE) ×2
SUT VIC AB 0 CT1 27XBRD ANBCTR (SUTURE) ×1 IMPLANT
SUT VIC AB 2-0 CT1 27 (SUTURE) ×2
SUT VIC AB 2-0 CT1 TAPERPNT 27 (SUTURE) ×1 IMPLANT
SUT VIC AB 3-0 SH 8-18 (SUTURE) ×3 IMPLANT
SUTURE FIBERWR#2 38 REV NDL BL (SUTURE) ×5 IMPLANT
TOWEL OR 17X24 6PK STRL BLUE (TOWEL DISPOSABLE) ×3 IMPLANT
TOWEL OR 17X26 10 PK STRL BLUE (TOWEL DISPOSABLE) ×3 IMPLANT
TOWER SMARTMIX MINI (MISCELLANEOUS) ×1 IMPLANT
TRAY FOLEY CATH 16FRSI W/METER (SET/KITS/TRAYS/PACK) IMPLANT
WATER STERILE IRR 1000ML POUR (IV SOLUTION) IMPLANT

## 2014-12-09 NOTE — Op Note (Signed)
12/09/2014  10:25 AM  PATIENT:  Emily Spence    PRE-OPERATIVE DIAGNOSIS:  Right four-part proximal humerus fracture dislocation  POST-OPERATIVE DIAGNOSIS:  Same  PROCEDURE:  RIGHT REVERSE SHOULDER ARTHROPLASTY  SURGEON:  Johnny Bridge, MD  PHYSICIAN ASSISTANT: Joya Gaskins, OPA-C, present and scrubbed throughout the case, critical for completion in a timely fashion, and for retraction, instrumentation, and closure.  ANESTHESIA:   General  PREOPERATIVE INDICATIONS:  Emily Spence is a  81 y.o. female who fell and had a right proximal humerus fracture dislocation. Initially she elected for nonsurgical management, but after a week she decided to go ahead and proceed with surgical intervention.  The risks benefits and alternatives were discussed with the patient preoperatively including but not limited to the risks of infection, bleeding, nerve injury, cardiopulmonary complications, the need for revision surgery, dislocation, brachial plexus palsy, incomplete relief of pain, among others, and the patient was willing to proceed.  OPERATIVE IMPLANTS: Biomet size 8 humeral stem cemented with a 44 mm reverse shoulder arthroplasty tray with a +3 liner and a 36 mm glenosphere with a mini baseplate and 4 locking screws and one central nonlocking screw.  OPERATIVE FINDINGS: Extreme comminution, displaced humeral head, significant bone loss shaft proximally.  OPERATIVE PROCEDURE: The patient was brought to the operating room and placed in the supine position. General anesthesia was administered. IV antibiotics were given. Time out was performed. The upper extremity was prepped and draped in usual sterile fashion. The patient was in a beachchair position. Deltopectoral approach was carried out. The biceps was tenodesed to the pectoralis tendon with #2 Fiberwire. The lesser tuberosity was mobilized, I used a bone cutter to remove the biceps interval where the fracture was located just lateral  to the interval.  I placed tag stitches in the subscapularis, as well as the infraspinatus. I excised the supraspinatus and the superior bone.  I removed the humeral head. I then exposed the glenoid.  Deep retractors were placed, and I resected the labrum, and a small bit of the inferior capsule in order to visualize the inferior glenoid, and then placed a guidepin into the center position on the glenoid, with slight inferior inclination. I then reamed over the guidepin, and this created a small metaphyseal cancellus blush inferiorly, removing just the cartilage to the subchondral bone superiorly. The base plate was selected and impacted place, and then I secured it centrally with a nonlocking screw, and I had excellent purchase both inferiorly and superiorly. I placed a short locking screws on anterior and posterior aspects.  I then turned my attention to the glenosphere, and impacted this into place, placing slight inferior offset (set on B).   The glenoid sphere was completely seated, and had engagement of the [facility] taper. I then turned my attention back to the humerus.  I sequentially broached, and then trialed, and was found to restore soft tissue tension, and it had 2 finger tightness. Therefore the above named components were selected. The shoulder felt stable throughout functional motion.  Before I placed the real prosthesis I had also placed a #2 FiberWire through drill holes in the humerus for later subscapularis and infraspinatus around the world repair. I also placed additional FiberWire sutures 2 in the lesser tuberosity and 2 in the greater tuberosity posteriorly for connection to the fracture stem.  I then cemented the real prosthesis into place, with the height set at 3, and then trialed with the tray, and the +3 tray was snug, but stable.  I selected  this component, placed it into place, reduced the head, and passed all of the sutures through the fin of the fracture stem after the  cement had cured. I placed bone graft beneath the tuberosities in order to optimize healing. I passed the around the world stitch through the lesser tuberosity and infraspinatus. I then used the tag stitches to suture the 2 tuberosities together.  I irrigated the wounds copiously.   Excellent bony reconstruction and soft tissue reconstruction achieved with coverage anteriorly and posteriorly.  I then irrigated the shoulder copiously once more, repaired the deltopectoral interval with Vicryl followed by subcutaneous Vicryl with Steri-Strips and sterile gauze for the skin. The patient was awakened and returned back in stable and satisfactory condition. There no complications and She tolerated the procedure well.

## 2014-12-09 NOTE — Anesthesia Preprocedure Evaluation (Addendum)
Anesthesia Evaluation  Patient identified by MRN, date of birth, ID band Patient awake    Reviewed: Allergy & Precautions, NPO status , Patient's Chart, lab work & pertinent test results  Airway Mallampati: II  TM Distance: >3 FB Neck ROM: Full    Dental no notable dental hx. (+) Teeth Intact   Pulmonary neg pulmonary ROS, former smoker,  breath sounds clear to auscultation  Pulmonary exam normal       Cardiovascular hypertension, Pt. on medications Rhythm:Regular Rate:Normal     Neuro/Psych negative neurological ROS  negative psych ROS   GI/Hepatic negative GI ROS, Neg liver ROS,   Endo/Other  Hypothyroidism   Renal/GU negative Renal ROS  negative genitourinary   Musculoskeletal negative musculoskeletal ROS (+)   Abdominal   Peds negative pediatric ROS (+)  Hematology  (+) anemia ,   Anesthesia Other Findings   Reproductive/Obstetrics negative OB ROS                            Anesthesia Physical Anesthesia Plan  ASA: III  Anesthesia Plan: General   Post-op Pain Management:    Induction: Intravenous  Airway Management Planned: Oral ETT  Additional Equipment:   Intra-op Plan:   Post-operative Plan: Extubation in OR  Informed Consent: I have reviewed the patients History and Physical, chart, labs and discussed the procedure including the risks, benefits and alternatives for the proposed anesthesia with the patient or authorized representative who has indicated his/her understanding and acceptance.   Dental advisory given  Plan Discussed with: CRNA and Surgeon  Anesthesia Plan Comments:         Anesthesia Quick Evaluation

## 2014-12-09 NOTE — Anesthesia Procedure Notes (Addendum)
Anesthesia Regional Block:  Interscalene brachial plexus block  Pre-Anesthetic Checklist: ,, timeout performed, Correct Patient, Correct Site, Correct Laterality, Correct Procedure, Correct Position, site marked, Risks and benefits discussed,  Surgical consent,  Pre-op evaluation,  At surgeon's request and post-op pain management  Laterality: Right  Prep: chloraprep       Needles:  Injection technique: Single-shot  Needle Type: Echogenic Stimulator Needle     Needle Length: 9cm 9 cm Needle Gauge: 21 and 21 G    Additional Needles:  Procedures: ultrasound guided (picture in chart) Interscalene brachial plexus block Narrative:  Injection made incrementally with aspirations every 5 mL.  Performed by: Personally  Anesthesiologist: ROSE, Iona Beard  Additional Notes: Patient tolerated the procedure well without complications   Procedure Name: Intubation Date/Time: 12/09/2014 7:40 AM Performed by: Rush Farmer E Pre-anesthesia Checklist: Patient identified, Emergency Drugs available, Suction available, Patient being monitored and Timeout performed Patient Re-evaluated:Patient Re-evaluated prior to inductionOxygen Delivery Method: Circle system utilized Preoxygenation: Pre-oxygenation with 100% oxygen Intubation Type: IV induction Ventilation: Mask ventilation without difficulty Laryngoscope Size: Mac and 3 Grade View: Grade III Tube type: Oral Tube size: 7.0 mm Number of attempts: 2 Airway Equipment and Method: Bougie stylet Placement Confirmation: ETT inserted through vocal cords under direct vision,  positive ETCO2 and breath sounds checked- equal and bilateral Secured at: 21 cm Tube secured with: Tape Dental Injury: Injury to lip  Comments: DL x1 with MAC 3 per CRNA, unable to visualize VC. 2nd DL with MAC3 per MDA, Grade III view. Blue bougie used for intubation. +ETCO2 & BBS=.

## 2014-12-09 NOTE — H&P (Signed)
PREOPERATIVE H&P  Chief Complaint: RIGHT PROXIMAL HUMERUS FRACTURE  HPI: Emily Spence is a 81 y.o. female who presents for preoperative history and physical with a diagnosis of RIGHT PROXIMAL HUMERUS FRACTURE. Symptoms are rated as moderate to severe, and have been worsening.  This is significantly impairing activities of daily living.  She has elected for surgical management. She fell last week and has lost all independence and elected for surgery.  Past Medical History  Diagnosis Date  . Hypertension   . Thyroid disease   . Closed 4-part fracture of proximal end of right humerus 12/01/2014  . Hypothyroidism    Past Surgical History  Procedure Laterality Date  . Appendectomy    . Tonsillectomy    . Cataract extraction w/ intraocular lens  implant, bilateral     History   Social History  . Marital Status: Widowed    Spouse Name: N/A  . Number of Children: N/A  . Years of Education: N/A   Social History Main Topics  . Smoking status: Former Research scientist (life sciences)  . Smokeless tobacco: Not on file  . Alcohol Use: No  . Drug Use: No  . Sexual Activity: Not on file   Other Topics Concern  . None   Social History Narrative   History reviewed. No pertinent family history. Allergies  Allergen Reactions  . Simvastatin     unknown  . Sulfur     unknown   Prior to Admission medications   Medication Sig Start Date End Date Taking? Authorizing Provider  amLODipine (NORVASC) 2.5 MG tablet Take 2.5 mg by mouth daily.   Yes Historical Provider, MD  aspirin 81 MG chewable tablet Chew 81 mg by mouth daily.   Yes Historical Provider, MD  levothyroxine (SYNTHROID, LEVOTHROID) 50 MCG tablet Take 50 mcg by mouth See admin instructions. 1 tab daily except 1&1/2 tab on Tues and Thurs   Yes Historical Provider, MD  acetaminophen (TYLENOL) 500 MG tablet Take 1,000 mg by mouth every 6 (six) hours as needed for mild pain.    Historical Provider, MD  sodium chloride (OCEAN) 0.65 % SOLN nasal spray Place  1 spray into both nostrils as needed for congestion.    Historical Provider, MD  Zinc Gluconate 13.3 MG LOZG Use as directed 1 lozenge in the mouth or throat daily as needed (cold symptoms).    Historical Provider, MD     Positive ROS: All other systems have been reviewed and were otherwise negative with the exception of those mentioned in the HPI and as above.  Physical Exam: General: Alert, no acute distress Cardiovascular: No pedal edema Respiratory: No cyanosis, no use of accessory musculature GI: No organomegaly, abdomen is soft and non-tender Skin: No lesions in the area of chief complaint Neurologic: Sensation intact distally Psychiatric: Patient is competent for consent with normal mood and affect Lymphatic: No axillary or cervical lymphadenopathy  MUSCULOSKELETAL: no active right shoulder movement, but deltoid intact, sensation intact throughout.  Assessment: RIGHT PROXIMAL HUMERUS FRACTURE  Plan: Plan for Procedure(s): RIGHT REVERSE SHOULDER ARTHROPLASTY  The risks benefits and alternatives were discussed with the patient including but not limited to the risks of nonoperative treatment, versus surgical intervention including infection, bleeding, nerve injury, malunion, nonunion, the need for revision surgery, hardware prominence, dislocation, hardware failure, the need for hardware removal, blood clots, cardiopulmonary complications, morbidity, mortality, among others, and they were willing to proceed.     Johnny Bridge, MD Cell [phone]   12/09/2014 7:22 AM

## 2014-12-09 NOTE — Anesthesia Postprocedure Evaluation (Signed)
  Anesthesia Post-op Note  Patient: Emily Spence  Procedure(s) Performed: Procedure(s): RIGHT REVERSE SHOULDER ARTHROPLASTY (Right)  Patient Location: PACU  Anesthesia Type:General  Level of Consciousness: awake, alert  and oriented  Airway and Oxygen Therapy: Patient Spontanous Breathing  Post-op Pain: none  Post-op Assessment: Post-op Vital signs reviewed, Patient's Cardiovascular Status Stable, Respiratory Function Stable, Patent Airway, No signs of Nausea or vomiting and Pain level controlled  Post-op Vital Signs: Reviewed and stable  Last Vitals:  Filed Vitals:   12/09/14 1130  BP: 92/42  Pulse: 92  Temp:   Resp: 15    Complications: No apparent anesthesia complications

## 2014-12-09 NOTE — Progress Notes (Signed)
Utilization review completed.  

## 2014-12-09 NOTE — Transfer of Care (Signed)
Immediate Anesthesia Transfer of Care Note  Patient: Emily Spence  Procedure(s) Performed: Procedure(s): RIGHT REVERSE SHOULDER ARTHROPLASTY (Right)  Patient Location: PACU  Anesthesia Type:General  Level of Consciousness: awake, alert  and oriented  Airway & Oxygen Therapy: Patient Spontanous Breathing and Patient connected to nasal cannula oxygen  Post-op Assessment: Report given to RN and Post -op Vital signs reviewed and stable  Post vital signs: Reviewed and stable  Last Vitals:  Filed Vitals:   12/09/14 0610  BP: 157/66  Pulse: 79  Temp: 36.4 C  Resp: 18    Complications: No apparent anesthesia complications

## 2014-12-09 NOTE — Progress Notes (Signed)
Orthopedic Tech Progress Note Patient Details:  Emily Spence [birth year] [id number] Patient already has arm sling. Patient ID: [name], female   DOB: [birth year], 81 y.o.   MRN: [id number]   Braulio Bosch 12/09/2014, 3:38 PM

## 2014-12-09 NOTE — Discharge Instructions (Signed)
Diet: As you were doing prior to hospitalization  ° °Shower:  May shower but keep the wounds dry, use an occlusive plastic wrap, NO SOAKING IN TUB.  If the bandage gets wet, change with a clean dry gauze. ° °Dressing:  You may change your dressing 3-5 days after surgery.  Then change the dressing daily with sterile gauze dressing.   ° °There are sticky tapes (steri-strips) on your wounds and all the stitches are absorbable.  Leave the steri-strips in place when changing your dressings, they will peel off with time, usually 2-3 weeks. ° °Activity:  Increase activity slowly as tolerated, but follow the weight bearing instructions below.  No lifting or driving for 6 weeks. ° °Weight Bearing:   Sling at all times..   ° °To prevent constipation: you may use a stool softener such as - ° °Colace (over the counter) 100 mg by mouth twice a day  °Drink plenty of fluids (prune juice may be helpful) and high fiber foods °Miralax (over the counter) for constipation as needed.   ° °Itching:  If you experience itching with your medications, try taking only a single pain pill, or even half a pain pill at a time.  You may take up to 10 pain pills per day, and you can also use benadryl over the counter for itching or also to help with sleep.  ° °Precautions:  If you experience chest pain or shortness of breath - call 911 immediately for transfer to the hospital emergency department!! ° °If you develop a fever greater that 101 F, purulent drainage from wound, increased redness or drainage from wound, or calf pain -- Call the office at [phone]                                                °Follow- Up Appointment:  Please call for an appointment to be seen in 2 weeks [place] - [phone] ° ° ° ° ° °

## 2014-12-10 LAB — CBC
HCT: 23.8 % — ABNORMAL LOW (ref 36.0–46.0)
Hemoglobin: 7.7 g/dL — ABNORMAL LOW (ref 12.0–15.0)
MCH: 30.3 pg (ref 26.0–34.0)
MCHC: 32.4 g/dL (ref 30.0–36.0)
MCV: 93.7 fL (ref 78.0–100.0)
PLATELETS: 170 10*3/uL (ref 150–400)
RBC: 2.54 MIL/uL — ABNORMAL LOW (ref 3.87–5.11)
RDW: 14.5 % (ref 11.5–15.5)
WBC: 8.8 10*3/uL (ref 4.0–10.5)

## 2014-12-10 LAB — BASIC METABOLIC PANEL
Anion gap: 6 (ref 5–15)
BUN: 20 mg/dL (ref 6–23)
CO2: 27 mmol/L (ref 19–32)
CREATININE: 0.92 mg/dL (ref 0.50–1.10)
Calcium: 7.9 mg/dL — ABNORMAL LOW (ref 8.4–10.5)
Chloride: 103 mmol/L (ref 96–112)
GFR calc Af Amer: 62 mL/min — ABNORMAL LOW (ref >90)
GFR, EST NON AFRICAN AMERICAN: 53 mL/min — AB (ref >90)
GLUCOSE: 142 mg/dL — AB (ref 70–99)
Potassium: 4.4 mmol/L (ref 3.5–5.1)
SODIUM: 136 mmol/L (ref 135–145)

## 2014-12-10 MED ORDER — HYDROCODONE-ACETAMINOPHEN 5-325 MG PO TABS
1.0000 | ORAL_TABLET | Freq: Four times a day (QID) | ORAL | Status: DC | PRN
  Administered 2014-12-12: 1 via ORAL
  Filled 2014-12-10: qty 1

## 2014-12-10 NOTE — Progress Notes (Signed)
    Subjective:  Patient reports pain as mild/mod  Objective:   VITALS:   Filed Vitals:   12/09/14 1409 12/09/14 2230 12/10/14 0100 12/10/14 0430  BP: 106/44 111/56 113/55 140/68  Pulse: 98 93 93 91  Temp: 98.7 F (37.1 C) 98 F (36.7 C) 98.3 F (36.8 C) 98.1 F (36.7 C)  TempSrc: Oral Oral Oral Oral  Resp: 18 18 18 18   SpO2: 94% 91% 93% 97%    Physical Exam  Dressing: C/D/I  Compartments soft  SILT M/R/U, 2+rad puls, +EPL/FPL/IO  LABS  Results for orders placed or performed during the hospital encounter of 12/09/14 (from the past 24 hour(s))  CBC     Status: Abnormal   Collection Time: 12/10/14  5:39 AM  Result Value Ref Range   WBC 8.8 4.0 - 10.5 K/uL   RBC 2.54 (L) 3.87 - 5.11 MIL/uL   Hemoglobin 7.7 (L) 12.0 - 15.0 g/dL   HCT 23.8 (L) 36.0 - 46.0 %   MCV 93.7 78.0 - 100.0 fL   MCH 30.3 26.0 - 34.0 pg   MCHC 32.4 30.0 - 36.0 g/dL   RDW 14.5 11.5 - 15.5 %   Platelets 170 150 - 400 K/uL  Basic metabolic panel     Status: Abnormal   Collection Time: 12/10/14  5:39 AM  Result Value Ref Range   Sodium 136 135 - 145 mmol/L   Potassium 4.4 3.5 - 5.1 mmol/L   Chloride 103 96 - 112 mmol/L   CO2 27 19 - 32 mmol/L   Glucose, Bld 142 (H) 70 - 99 mg/dL   BUN 20 6 - 23 mg/dL   Creatinine, Ser 0.92 0.50 - 1.10 mg/dL   Calcium 7.9 (L) 8.4 - 10.5 mg/dL   GFR calc non Af Amer 53 (L) >90 mL/min   GFR calc Af Amer 62 (L) >90 mL/min   Anion gap 6 5 - 15     Assessment/Plan: 1 Day Post-Op   Active Problems:   Closed 4-part fracture of proximal end of right humerus   Fracture of humerus, proximal, right, closed   PLAN: Weight Bearing: Sling full time Dressings: Keep C/D/I Dispo to SNF likely Monday PT/OT   Sherae Santino, D 12/10/2014, 3:14 PM   Edmonia Lynch, MD Cell [phone]

## 2014-12-10 NOTE — Progress Notes (Signed)
Occupational Therapy Evaluation Patient Details Name: Emily Spence MRN: [id number] DOB: [birth year] Today's Date: 12/10/2014    History of Present Illness s/p mechanical fall with R humerus fx. Underwent r reverse shoulder athrooplasty 2/12.   Clinical Impression   PTA, pt lived in [place] apt at [facility] Pt required help with ADL only after fall. Recommend rehab at SNF. Began education with pt/family regarding rehab s/p reverse TSA and need for general rehab due to generalized weakness. Pt with significant desat to high 70s on RA during mobility. No SOB noted. 2L O2 applied and O2 rebound to 96. Nursing notified. Will follow acutely to address established goals and facilitate D/C to SNF.     Follow Up Recommendations  SNF;Supervision/Assistance - 24 hour    Equipment Recommendations  3 in 1 bedside comode    Recommendations for Other Services       Precautions / Restrictions Precautions Precautions: Fall;Shoulder Type of Shoulder Precautions: no shoulder ROM allowed. Elbow/wrist hand only Shoulder Interventions: At all times;Off for dressing/bathing/exercises;Shoulder sling/immobilizer Precaution Booklet Issued: Yes (comment) Required Braces or Orthoses: Sling Restrictions Weight Bearing Restrictions: Yes RUE Weight Bearing: Non weight bearing      Mobility Bed Mobility Overal bed mobility: Needs Assistance Bed Mobility: Supine to Sit     Supine to sit: Mod assist;HOB elevated        Transfers Overall transfer level: Needs assistance Equipment used: 1 person hand held assist Transfers: Sit to/from [facility] Sit to Stand: Min assist Stand pivot transfers: Mod assist       General transfer comment: posterior lean. Pt holding onto chair/rail with LUE to balance self.    Balance Overall balance assessment: Needs assistance;History of Falls Sitting-balance support: Feet supported;Single extremity supported Sitting  balance-Leahy Scale: Fair Sitting balance - Comments: influenced by lethargy - ?pain meds   Standing balance support: During functional activity;Single extremity supported Standing balance-Leahy Scale: Poor Standing balance comment: posterior bias                            ADL Overall ADL's : Needs assistance/impaired Eating/Feeding: Set up;Supervision/ safety Eating/Feeding Details (indicate cue type and reason): educated family on need to have pt feed herself and only drink in upright position due to lethargy and to decrease risk of aspiration Grooming: Moderate assistance   Upper Body Bathing: Moderate assistance;Sitting   Lower Body Bathing: Maximal assistance;Sit to/from stand   Upper Body Dressing : Maximal assistance;Sitting   Lower Body Dressing: Maximal assistance;Sit to/from stand   Toilet Transfer: Moderate assistance;Stand-pivot (simulated)   Toileting- Clothing Manipulation and Hygiene: Total assistance Toileting - Clothing Manipulation Details (indicate cue type and reason): using brief     Functional mobility during ADLs: Moderate assistance;Cueing for sequencing;Cueing for safety (stand pivot) General ADL Comments: significant decline infunction; began family education on techniques for bathing and dressing, sling use and positioning of RUE and precatuions. handout given. Son states plan is for pt to D/C to [facility] SNF for "2 days". Encouraged pt/family to participate in rehab for longer period of time at SNF prior to returning home.     Vision     Perception     Praxis      Pertinent Vitals/Pain Pain Assessment: Faces Faces Pain Scale: Hurts a little bit Pain Location: r shoulder Pain Descriptors / Indicators: Discomfort Pain Intervention(s): Limited activity within patient's tolerance;Monitored during session;Repositioned;Ice applied     Hand Dominance Right   Extremity/Trunk Assessment  Upper Extremity Assessment Upper Extremity  Assessment: RUE deficits/detail RUE Deficits / Details: r elbow/wrist/hand A/AAROM WFL. mod edema and bruising RUE: Unable to fully assess due to immobilization RUE Coordination: decreased fine motor;decreased gross motor   Lower Extremity Assessment Lower Extremity Assessment: Generalized weakness   Cervical / Trunk Assessment Cervical / Trunk Assessment: Kyphotic   Communication Communication Communication: HOH   Cognition Arousal/Alertness: Lethargic Behavior During Therapy: WFL for tasks assessed/performed Overall Cognitive Status: History of cognitive impairments - at baseline       Memory: Decreased short-term memory (per family at baseline. able to live alone)             General Comments       Exercises Exercises: Other exercises;Shoulder     Shoulder Instructions Shoulder Instructions Donning/doffing shirt without moving shoulder: Maximal assistance Method for sponge bathing under operated UE: Maximal assistance Donning/doffing sling/immobilizer: Maximal assistance Correct positioning of sling/immobilizer: Maximal assistance ROM for elbow, wrist and digits of operated UE: Moderate assistance Sling wearing schedule (on at all times/off for ADL's): Maximal assistance Proper positioning of operated UE when showering: Maximal assistance Positioning of UE while sleeping: Maximal assistance    Home Living Family/patient expects to be discharged to:: Skilled nursing facility                                 Additional Comments: [facility].       Prior Functioning/Environment Level of Independence: Independent with assistive device(s)        Comments: Was mod I with RW/cane prior to fall 1 week ago. Lived in independent living apt    OT Diagnosis: Generalized weakness;Acute pain   OT Problem List: Decreased strength;Decreased range of motion;Decreased activity tolerance;Impaired balance (sitting and/or standing);Decreased  coordination;Decreased safety awareness;Decreased knowledge of precautions;Impaired UE functional use;Pain;Increased edema   OT Treatment/Interventions: Self-care/ADL training;Therapeutic exercise;DME and/or AE instruction;Therapeutic activities;Patient/family education;Balance training    OT Goals(Current goals can be found in the care plan section) Acute Rehab OT Goals Patient Stated Goal: to get better OT Goal Formulation: With patient Time For Goal Achievement: 12/24/14 Potential to Achieve Goals: Good  OT Frequency: Min 2X/week   Barriers to D/C:            Co-evaluation              End of Session Equipment Utilized During Treatment: Gait belt;Oxygen Nurse Communication: Mobility status;Precautions;Weight bearing status  Activity Tolerance: Patient tolerated treatment well Patient left: in chair;with call bell/phone within reach;with family/visitor present   Time: 6761-9509 OT Time Calculation (min): 59 min Charges:  OT General Charges $OT Visit: 1 Procedure OT Evaluation $Initial OT Evaluation Tier I: 1 Procedure OT Treatments $Self Care/Home Management : 23-37 mins $Therapeutic Exercise: 8-22 mins G-Codes:    Mayukha Symmonds,H[place]ARY December 30, 2014, 10:05 AM   Maurie Boettcher, OTR/L  [phone] 12/30/2014

## 2014-12-10 NOTE — Progress Notes (Signed)
Patient refused to get OOB and would not let staff adjust arm for comfort.  Patient stated " I just had surgery, I can't move my arm"  Education given, patient very non-compliant.  Nsg to continue to monitor for status changes.

## 2014-12-10 NOTE — Progress Notes (Signed)
Patient doing well but a bit confused.  Axillary nerve firing and sensation intact over deltoid.  All fingers f/e/abduct.  Dc morphine, benadryl, robaxin, reduce norco to 5/325.  Doing well.  Plan snf mon.

## 2014-12-10 NOTE — Evaluation (Signed)
Physical Therapy Evaluation Patient Details Name: Marisabel Macpherson MRN: [id number] DOB: [birth year] Today's Date: 12/10/2014   History of Present Illness  s/p mechanical fall with R humerus fx. Underwent r reverse shoulder athroplasty 2/12.  Past Medical History  Diagnosis Date  . Hypertension   . Thyroid disease   . Closed 4-part fracture of proximal end of right humerus 12/01/2014  . Hypothyroidism    Past Surgical History  Procedure Laterality Date  . Appendectomy    . Tonsillectomy    . Cataract extraction w/ intraocular lens  implant, bilateral       Clinical Impression  Patient is s/p above surgery resulting in functional limitations due to the deficits listed below (see PT Problem List).  Patient will benefit from skilled PT to increase their independence and safety with mobility to allow discharge to the venue listed below.       Follow Up Recommendations SNF    Equipment Recommendations  3in1 (PT)    Recommendations for Other Services       Precautions / Restrictions Precautions Precautions: Fall;Shoulder Type of Shoulder Precautions: no shoulder ROM allowed. Elbow/wrist hand only Shoulder Interventions: At all times;Off for dressing/bathing/exercises;Shoulder sling/immobilizer Precaution Booklet Issued: Yes (comment) Required Braces or Orthoses: Sling Restrictions Weight Bearing Restrictions: Yes RUE Weight Bearing: Non weight bearing      Mobility  Bed Mobility Overal bed mobility: Needs Assistance Bed Mobility: Sit to Supine       Sit to supine: Max assist   General bed mobility comments: Max assist as pt was fatigued post amb  Transfers Overall transfer level: Needs assistance Equipment used: 1 person hand held assist Transfers: Sit to/from Stand Sit to Stand: Min assist         General transfer comment: posterior lean. Pt holding onto therapist with LUE to balance self.  Ambulation/Gait Ambulation/Gait assistance: Mod assist Ambulation  Distance (Feet): 18 Feet Assistive device: 1 person hand held assist Gait Pattern/deviations: Decreased stride length     General Gait Details: Quite unsteady and dependent on L UE support and support at R trunk/gait belt  Stairs            Wheelchair Mobility    Modified Rankin (Stroke Patients Only)       Balance     Sitting balance-Leahy Scale: Fair Sitting balance - Comments: influenced by lethargy - ?pain meds     Standing balance-Leahy Scale: Poor Standing balance comment: psoterior bias                             Pertinent Vitals/Pain Pain Assessment: Faces Faces Pain Scale: Hurts little more Pain Location: R shoulder Pain Descriptors / Indicators: Grimacing Pain Intervention(s): Limited activity within patient's tolerance;Monitored during session;Repositioned    Home Living Family/patient expects to be discharged to:: Skilled nursing facility                 Additional Comments: [facility].     Prior Function Level of Independence: Independent with assistive device(s)         Comments: Was mod I with RW/cane prior to fall 1 week ago. Lived in independent living apt     Hand Dominance   Dominant Hand: Right    Extremity/Trunk Assessment   Upper Extremity Assessment: Defer to OT evaluation RUE Deficits / Details: r elbow/wrist/hand A/AAROM WFL. mod edema and bruising         Lower Extremity Assessment: Generalized weakness  Cervical / Trunk Assessment: Kyphotic  Communication   Communication: HOH  Cognition Arousal/Alertness: Lethargic Behavior During Therapy: WFL for tasks assessed/performed Overall Cognitive Status: History of cognitive impairments - at baseline       Memory: Decreased short-term memory (per family at baseline. able to live alone)              General Comments General comments (skin integrity, edema, etc.): Walked on 2L O2 and noted desaturation to mid 80s; increased to 3 LO2,  cued for deep breathing, and sats incr to greater tahn or equal to 93%; Ended session with pt on 2 L o2 in bed    Exercises        Assessment/Plan    PT Assessment Patient needs continued PT services  PT Diagnosis Difficulty walking;Generalized weakness;Acute pain   PT Problem List Decreased strength;Decreased range of motion;Decreased activity tolerance;Decreased balance;Decreased mobility;Decreased coordination;Decreased cognition;Decreased knowledge of use of DME;Pain;Cardiopulmonary status limiting activity;Decreased knowledge of precautions  PT Treatment Interventions DME instruction;Gait training;Functional mobility training;Therapeutic activities;Therapeutic exercise;Balance training;Neuromuscular re-education;Cognitive remediation;Patient/family education   PT Goals (Current goals can be found in the Care Plan section) Acute Rehab PT Goals Patient Stated Goal: to get better PT Goal Formulation: With patient Time For Goal Achievement: 12/24/14 Potential to Achieve Goals: Good    Frequency Min 3X/week   Barriers to discharge        Co-evaluation               End of Session Equipment Utilized During Treatment: Gait belt;Oxygen Activity Tolerance: Patient tolerated treatment well Patient left: in bed;with call bell/phone within reach;with family/visitor present Nurse Communication: Mobility status         Time: 1322-1350 PT Time Calculation (min) (ACUTE ONLY): 28 min   Charges:   PT Evaluation $Initial PT Evaluation Tier I: 1 Procedure PT Treatments $Gait Training: 8-22 mins   PT G Codes:        Quin Hoop 12/10/2014, 2:24 PM  Roney Marion, [place] Pager [phone] Office [phone]

## 2014-12-11 NOTE — Progress Notes (Signed)
    Subjective:  Patient reports pain as mild to moderate.  She was able to sleep well last night.  Her arm remains supported in the sling.    Objective:   VITALS:   Filed Vitals:   12/10/14 1528 12/10/14 2039 12/11/14 0100 12/11/14 0507  BP: 132/54 123/47  97/64  Pulse: 88 86  87  Temp: 98.3 F (36.8 C) 98.4 F (36.9 C)  98.5 F (36.9 C)  TempSrc: Oral Oral  Oral  Resp: 18 18  18   Height:   5\' 2"  (1.575 m)   Weight:   58.6 kg (129 lb 3 oz)   SpO2: 97% 98%  100%    Physical Exam The patient is resting comfortably in bed. Right arm is supported in the sling.  Dressing: C/D/I  Compartments soft  SILT M/R/U, 2+rad puls, +EPL/FPL/IO   LABS  No results found for this or any previous visit (from the past 24 hour(s)).   Assessment/Plan: Active Problems:   Closed 4-part fracture of proximal end of right humerus   Fracture of humerus, proximal, right, closed   PLAN: Weight Bearing: right arm in sling at all times Dressings: C/D/I  PT/OT Dispo: the plan is for the patient to be d/c to snf Shelva Majestic, D 12/11/2014, 7:59 AM   Edmonia Lynch, MD Cell [phone]

## 2014-12-11 NOTE — Clinical Social Work Placement (Addendum)
Clinical Social Work Department CLINICAL SOCIAL WORK PLACEMENT NOTE 12/11/2014  Patient:  Emily Spence, Emily Spence  Account Number:  [id number] [place] date:  12/09/2014  Clinical Social Worker:  Carrington Clamp, [place]  Date/time:  12/11/2014 10:00 PM  Clinical Social Work is seeking post-discharge placement for this patient at the following level of care:   [place]   (*CSW will update this form in Epic as items are completed)     Patient/family provided with [facility] Department of Clinical Social Work's list of facilities offering this level of care within the geographic area requested by the patient (or if unable, by the patient's family).    Patient/family informed of their freedom to choose among providers that offer the needed level of care, that participate in Medicare, Medicaid or managed care program needed by the patient, have an available bed and are willing to accept the patient.    Patient/family informed of [facility]' ownership interest in [facility], as well as of the fact that they are under no obligation to receive care at this facility.  PASARR submitted to EDS on 12/11/2014 PASARR number received on 12/11/2014  FL2 transmitted to all facilities in geographic area requested by pt/family on   FL2 transmitted to all facilities within larger geographic area on   Patient informed that his/her managed care company has contracts with or will negotiate with  certain facilities, including the following:   [facility]     Patient/family informed of bed offers received:   Patient chooses bed at  Physician recommends and patient chooses bed at    Patient to be transferred to [place] on  12/12/2014 Patient to be transferred to facility by EMS Patient and family notified of transfer on 12/12/2014 Name of family member notified:  patient  The following physician request were entered in Epic:   Additional Comments: Physician  has requested [facility] and patient has accepted placement at facility.  [place], [place] Weekend Clinical Social Worker [phone]
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
## 2014-12-11 NOTE — Progress Notes (Addendum)
Occupational Therapy Treatment Patient Details Name: Emily Spence MRN: [id number] DOB: May 14, 1922 Today's Date: 12/11/2014    History of present illness 81 y.o. s/p mechanical fall with R humerus fx. Underwent r reverse shoulder athrooplasty 2/12.   OT comments  Pt progressing. Education provided in session to pt and son. Continue to recommend SNF for rehab.  Follow Up Recommendations  SNF;Supervision/Assistance - 24 hour    Equipment Recommendations  3 in 1 bedside comode    Recommendations for Other Services      Precautions / Restrictions Precautions Precautions: Fall;Shoulder (educated on shoulder precautions) Type of Shoulder Precautions: no shoulder ROM allowed. Elbow/wrist hand only Shoulder Interventions: At all times;Off for dressing/bathing/exercises;Shoulder sling/immobilizer Precaution Booklet Issued: Yes (comment) (wrote down additional information) Required Braces or Orthoses: Sling Restrictions Weight Bearing Restrictions: Yes RUE Weight Bearing: Non weight bearing       Mobility Bed Mobility Overal bed mobility: Needs Assistance Bed Mobility: Supine to Sit     Supine to sit: Max assist     General bed mobility comments: assist with hips and with trunk. cues for technique.  Transfers Overall transfer level: Needs assistance   Transfers: Sit to/from Stand;Stand Pivot Transfers Sit to Stand: Min assist Stand pivot transfers: Min assist       General transfer comment: cues for technique.     Balance Overall balance assessment:  (posterior lean sitting EOB)                                 ADL Overall ADL's : Needs assistance/impaired                         Toilet Transfer: Minimal assistance;Stand-pivot (from bed to chair)           Functional mobility during ADLs: Minimal assistance (stand pivot) General ADL Comments: See shoulder section below for education OT went over in session. explained ice may be  helpful to put on Rt UE and rubbing alcohol will take yellow off of arm. Explained benefit of sitting in chair. OT answering questions that son had.       Vision                     Perception     Praxis      Cognition  Awake/Alert Behavior During Therapy: WFL for tasks assessed/performed Overall Cognitive Status: History of cognitive impairments - at baseline                       Extremity/Trunk Assessment               Exercises Other Exercises Other Exercises: pt performed right elbow flexion/extension (AAROM), right AROM wrist flexion/extension, and right AROM digit composite flexion/extension. Explained that joints including neck can become stiff. Other Exercises: pt also performed neck ROM; explained to pt's son that elbow ROM can be performed 3-4x's per day and supine position would be easier and explained importance of joint ROM (no shoulder movement allowed). Donning/doffing shirt without moving shoulder:  (educated on technique and suggested button up shirts) Method for sponge bathing under operated UE:  (educated on technique) Donning/doffing sling/immobilizer:  (OT demonstrated/educated) Correct positioning of sling/immobilizer:  (OT educated/demonstrated) ROM for elbow, wrist and digits of operated UE: Minimal assistance (AAROM for elbow ROM sitting in chair) Sling wearing schedule (on at all times/off for ADL's):  (educated)  Proper positioning of operated UE when showering:  (educated) Positioning of UE while sleeping:  (educated)   Shoulder Instructions Shoulder Instructions Donning/doffing shirt without moving shoulder:  (educated on technique and suggested button up shirts) Method for sponge bathing under operated UE:  (educated on technique) Donning/doffing sling/immobilizer:  (OT demonstrated/educated) Correct positioning of sling/immobilizer:  (OT educated/demonstrated) ROM for elbow, wrist and digits of operated UE: Minimal assistance  (AAROM for elbow ROM sitting in chair) Sling wearing schedule (on at all times/off for ADL's):  (educated) Proper positioning of operated UE when showering:  (educated) Positioning of UE while sleeping:  (educated)     General Comments      Pertinent Vitals/ Pain       Pain Assessment:  (reports no pain just sitting still) Faces Pain Scale: Hurts a little bit Pain Location: Rt shoulder Pain Intervention(s): Monitored during session;Repositioned   **O2 dropped to 80's in session with pt on 2.5L. Cues for deep breathing and trended up to 90's.  Home Living                                          Prior Functioning/Environment              Frequency Min 2X/week     Progress Toward Goals  OT Goals(current goals can now be found in the care plan section)  Progress towards OT goals: Progressing toward goals  Acute Rehab OT Goals Patient Stated Goal: get out of here OT Goal Formulation: With patient Time For Goal Achievement: 12/24/14 Potential to Achieve Goals: Good ADL Goals Pt Will Perform Grooming: with min assist;with caregiver independent in assisting;sitting Pt Will Transfer to Toilet: with min assist;bedside commode;stand pivot transfer Pt/caregiver will Perform Home Exercise Program: Right Upper extremity;With written HEP provided;With Supervision (Rt elbow, wrist, hand AROM) Additional ADL Goal #1: pt/family will verbalize understanding of shoulder protocol for reverse TSA Additional ADL Goal #2: pt/family will verbalize understanding of importance for increasing mobility and time OOB to facilitate rehab and return to independent living. Additional ADL Goal #3: Family will be independent in assisting pt with ADLs while maintaining shoulder precautions.   Plan Discharge plan remains appropriate    Co-evaluation                 End of Session Equipment Utilized During Treatment: Gait belt;Oxygen;Other (comment) (sling)   Activity  Tolerance Patient tolerated treatment well   Patient Left in chair;with call bell/phone within reach;with family/visitor present   Nurse Communication Other (comment) (pt in chair; son requesting pt get bath)        Time: 7517-0017 OT Time Calculation (min): 29 min  Charges: OT General Charges $OT Visit: 1 Procedure OT Treatments $Self Care/Home Management : 8-22 mins $Therapeutic Exercise: 8-22 mins  Benito Mccreedy OTR/L [phone] 12/11/2014, 2:46 PM

## 2014-12-12 DIAGNOSIS — M79601 Pain in right arm: Secondary | ICD-10-CM | POA: Diagnosis not present

## 2014-12-12 DIAGNOSIS — S42201D Unspecified fracture of upper end of right humerus, subsequent encounter for fracture with routine healing: Secondary | ICD-10-CM | POA: Diagnosis not present

## 2014-12-12 DIAGNOSIS — R5381 Other malaise: Secondary | ICD-10-CM | POA: Diagnosis not present

## 2014-12-12 DIAGNOSIS — S42291D Other displaced fracture of upper end of right humerus, subsequent encounter for fracture with routine healing: Secondary | ICD-10-CM | POA: Diagnosis not present

## 2014-12-12 DIAGNOSIS — M6281 Muscle weakness (generalized): Secondary | ICD-10-CM | POA: Diagnosis not present

## 2014-12-12 DIAGNOSIS — W19XXXD Unspecified fall, subsequent encounter: Secondary | ICD-10-CM | POA: Diagnosis not present

## 2014-12-12 DIAGNOSIS — S42301A Unspecified fracture of shaft of humerus, right arm, initial encounter for closed fracture: Secondary | ICD-10-CM | POA: Diagnosis not present

## 2014-12-12 DIAGNOSIS — R262 Difficulty in walking, not elsewhere classified: Secondary | ICD-10-CM | POA: Diagnosis not present

## 2014-12-12 DIAGNOSIS — E039 Hypothyroidism, unspecified: Secondary | ICD-10-CM | POA: Diagnosis not present

## 2014-12-12 DIAGNOSIS — G8929 Other chronic pain: Secondary | ICD-10-CM | POA: Diagnosis not present

## 2014-12-12 DIAGNOSIS — I10 Essential (primary) hypertension: Secondary | ICD-10-CM | POA: Diagnosis not present

## 2014-12-12 DIAGNOSIS — R279 Unspecified lack of coordination: Secondary | ICD-10-CM | POA: Diagnosis not present

## 2014-12-12 DIAGNOSIS — K5900 Constipation, unspecified: Secondary | ICD-10-CM | POA: Diagnosis not present
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
## 2014-12-12 NOTE — Progress Notes (Signed)
Gave report to Sam the Conservation officer, historic buildings at [facility].

## 2014-12-12 NOTE — Discharge Planning (Signed)
Patient will discharge today per MD order. Patient will discharge to [facility] SNF (bundle) RN to call report prior to transportation to [phone] Transportation [facility]  CSW sent discharge summary to SNF for review.  Packet is complete.  RN, patient and family aware of discharge plans.  Nonnie Done, [place] [phone]  Psychiatric & Orthopedics (5N 1-16) Clinical Social Worker

## 2014-12-12 NOTE — Progress Notes (Signed)
Occupational Therapy Treatment Patient Details Name: Emily Spence MRN: [id number] DOB: Jan 18, 1922 Today's Date: 12/12/2014    History of present illness 81 y.o. s/p mechanical fall with R humerus fx. Underwent r reverse shoulder athrooplasty 2/12.   OT comments  Patient progressing towards goals, continue plan of care for now. Continue to recommend SNF for additional rehab.   Follow Up Recommendations  SNF;Supervision/Assistance - 24 hour    Equipment Recommendations  3 in 1 bedside comode    Recommendations for Other Services  None at this time.     Precautions / Restrictions Precautions Precautions: Fall;Shoulder Type of Shoulder Precautions: no shoulder ROM allowed. Elbow/wrist hand only. Reviewed shoulder precautions with patient Shoulder Interventions: At all times;Off for dressing/bathing/exercises;Shoulder sling/immobilizer Required Braces or Orthoses: Sling Restrictions Weight Bearing Restrictions: Yes RUE Weight Bearing: Non weight bearing       Mobility Bed Mobility General bed mobility comments: Patient stayed in bed during OT session   Transfers General transfer comment: No transfer performed         ADL General ADL Comments: See shoulder section for education reviewed during OT session and exercise section for exercises performed during session. Patient anxious during session and didn't tolerate physical assistance provided by therapist.  Patient took more than a reasonable amount of time to complete exercises and tended to argue with therapist. Therapist provided psychosocial support and explained OTs role.      Cognition   Behavior During Therapy: Anxious Overall Cognitive Status: History of cognitive impairments - at baseline        Exercises Shoulder Exercises Elbow Flexion: AAROM;Right;10 reps;AROM Elbow Extension: AAROM;Right;10 reps Wrist Flexion: AROM;10 reps;Right Wrist Extension: AROM;10 reps;Right Digit Composite Flexion: AROM;10  reps;Right (using stress ball) Composite Extension: AROM;Right;10 reps (using stress ball) Donning/doffing shirt without moving shoulder: Maximal assistance Donning/doffing sling/immobilizer: Maximal assistance Correct positioning of sling/immobilizer: Maximal assistance (educated and demonstrated, patient requires assistance for this) ROM for elbow, wrist and digits of operated UE: Minimal assistance (patient requires cues and encouragement to perform exercises) Sling wearing schedule (on at all times/off for ADL's): Maximal assistance (educated patient on this) Positioning of UE while sleeping: Maximal assistance (educated patient on this)   Shoulder Instructions Shoulder Instructions Donning/doffing shirt without moving shoulder: Maximal assistance Donning/doffing sling/immobilizer: Maximal assistance Correct positioning of sling/immobilizer: Maximal assistance (educated and demonstrated, patient requires assistance for this) ROM for elbow, wrist and digits of operated UE: Minimal assistance (patient requires cues and encouragement to perform exercises) Sling wearing schedule (on at all times/off for ADL's): Maximal assistance (educated patient on this) Positioning of UE while sleeping: Maximal assistance (educated patient on this)          Pertinent Vitals/ Pain       Pain Assessment: Faces Faces Pain Scale: Hurts even more (during elbow>distally exercises) Pain Location: right shoulder Pain Descriptors / Indicators: Grimacing Pain Intervention(s): Monitored during session         Frequency Min 2X/week     Progress Toward Goals  OT Goals(current goals can now be found in the care plan section)  Progress towards OT goals: Progressing toward goals     Plan Discharge plan remains appropriate          Activity Tolerance Patient tolerated treatment well   Patient Left in bed;with call bell/phone within reach     Time: 0258-5277 OT Time Calculation (min): 28  min  Charges: OT General Charges $OT Visit: 1 Procedure OT Treatments $Therapeutic Exercise: 23-37 mins  Gray Maugeri , MS, OTR/L, CLT  Pager: [phone]  12/12/2014, 1:19 PM

## 2014-12-13 ENCOUNTER — Encounter (HOSPITAL_COMMUNITY): Payer: Self-pay | Admitting: Orthopedic Surgery

## 2014-12-13 DIAGNOSIS — R5381 Other malaise: Secondary | ICD-10-CM | POA: Diagnosis not present

## 2014-12-13 DIAGNOSIS — E039 Hypothyroidism, unspecified: Secondary | ICD-10-CM | POA: Diagnosis not present

## 2014-12-13 DIAGNOSIS — I10 Essential (primary) hypertension: Secondary | ICD-10-CM | POA: Diagnosis not present

## 2014-12-13 NOTE — Progress Notes (Signed)
CARE MANAGEMENT NOTE 12/13/2014  Patient:  Emily Spence, Emily Spence   Account Number:  [id number]  Date Initiated:  12/09/2014  Documentation initiated by:  [facility]  Subjective/Objective Assessment:   s/p right reverse shoulder artthroplasty     Action/Plan:   Pt/OT evals- recommended SNF   Anticipated DC Date:  10/12/2015   Anticipated DC Plan:  SKILLED NURSING FACILITY  In-house referral  Clinical Social Worker      DC Planning Services  CM consult                 Status of service:  Completed, signed off Medicare Important Message given?  YES Date Medicare IM given:  12/12/2014 Medicare IM given by:  [facility]  Discharge Disposition:  [place]  Per UR Regulation:  Reviewed for med. necessity/level of care/duration of stay    Comments:  12/09/14 Informed by Joseph Art with MD office that patient d/c plan is [facility] SNF, not under her Medicare. Informed by Stanton Kidney with [facility] that patient is set up with them as well.

## 2014-12-22 DIAGNOSIS — S42291D Other displaced fracture of upper end of right humerus, subsequent encounter for fracture with routine healing: Secondary | ICD-10-CM | POA: Diagnosis not present

## 2014-12-26 DIAGNOSIS — M810 Age-related osteoporosis without current pathological fracture: Secondary | ICD-10-CM | POA: Diagnosis not present

## 2014-12-26 DIAGNOSIS — M6281 Muscle weakness (generalized): Secondary | ICD-10-CM | POA: Diagnosis not present

## 2014-12-26 DIAGNOSIS — Z471 Aftercare following joint replacement surgery: Secondary | ICD-10-CM | POA: Diagnosis not present

## 2014-12-26 DIAGNOSIS — G8929 Other chronic pain: Secondary | ICD-10-CM | POA: Diagnosis not present

## 2014-12-26 DIAGNOSIS — M545 Low back pain: Secondary | ICD-10-CM | POA: Diagnosis not present

## 2014-12-26 DIAGNOSIS — I10 Essential (primary) hypertension: Secondary | ICD-10-CM | POA: Diagnosis not present

## 2014-12-27 DIAGNOSIS — G8929 Other chronic pain: Secondary | ICD-10-CM | POA: Diagnosis not present

## 2014-12-27 DIAGNOSIS — M6281 Muscle weakness (generalized): Secondary | ICD-10-CM | POA: Diagnosis not present

## 2014-12-27 DIAGNOSIS — Z471 Aftercare following joint replacement surgery: Secondary | ICD-10-CM | POA: Diagnosis not present

## 2014-12-27 DIAGNOSIS — M810 Age-related osteoporosis without current pathological fracture: Secondary | ICD-10-CM | POA: Diagnosis not present

## 2014-12-27 DIAGNOSIS — M545 Low back pain: Secondary | ICD-10-CM | POA: Diagnosis not present

## 2014-12-27 DIAGNOSIS — I10 Essential (primary) hypertension: Secondary | ICD-10-CM | POA: Diagnosis not present

## 2014-12-28 DIAGNOSIS — M6281 Muscle weakness (generalized): Secondary | ICD-10-CM | POA: Diagnosis not present

## 2014-12-28 DIAGNOSIS — M545 Low back pain: Secondary | ICD-10-CM | POA: Diagnosis not present

## 2014-12-28 DIAGNOSIS — Z471 Aftercare following joint replacement surgery: Secondary | ICD-10-CM | POA: Diagnosis not present

## 2014-12-28 DIAGNOSIS — I10 Essential (primary) hypertension: Secondary | ICD-10-CM | POA: Diagnosis not present

## 2014-12-28 DIAGNOSIS — G8929 Other chronic pain: Secondary | ICD-10-CM | POA: Diagnosis not present

## 2014-12-28 DIAGNOSIS — M810 Age-related osteoporosis without current pathological fracture: Secondary | ICD-10-CM | POA: Diagnosis not present

## 2014-12-30 DIAGNOSIS — M810 Age-related osteoporosis without current pathological fracture: Secondary | ICD-10-CM | POA: Diagnosis not present

## 2014-12-30 DIAGNOSIS — M545 Low back pain: Secondary | ICD-10-CM | POA: Diagnosis not present

## 2014-12-30 DIAGNOSIS — I10 Essential (primary) hypertension: Secondary | ICD-10-CM | POA: Diagnosis not present

## 2014-12-30 DIAGNOSIS — M6281 Muscle weakness (generalized): Secondary | ICD-10-CM | POA: Diagnosis not present

## 2014-12-30 DIAGNOSIS — Z471 Aftercare following joint replacement surgery: Secondary | ICD-10-CM | POA: Diagnosis not present

## 2014-12-30 DIAGNOSIS — G8929 Other chronic pain: Secondary | ICD-10-CM | POA: Diagnosis not present

## 2015-01-03 DIAGNOSIS — G8929 Other chronic pain: Secondary | ICD-10-CM | POA: Diagnosis not present

## 2015-01-03 DIAGNOSIS — Z471 Aftercare following joint replacement surgery: Secondary | ICD-10-CM | POA: Diagnosis not present

## 2015-01-03 DIAGNOSIS — I10 Essential (primary) hypertension: Secondary | ICD-10-CM | POA: Diagnosis not present

## 2015-01-03 DIAGNOSIS — M6281 Muscle weakness (generalized): Secondary | ICD-10-CM | POA: Diagnosis not present

## 2015-01-03 DIAGNOSIS — M545 Low back pain: Secondary | ICD-10-CM | POA: Diagnosis not present

## 2015-01-03 DIAGNOSIS — M810 Age-related osteoporosis without current pathological fracture: Secondary | ICD-10-CM | POA: Diagnosis not present

## 2015-01-03 NOTE — Progress Notes (Signed)
Physical Therapy Note  (Late entry for G Code correction)    2015/01/16 0800  PT G-Codes **NOT FOR INPATIENT CLASS**  Functional Assessment Tool Used Clinical Judgement  Functional Limitation Mobility: Walking and moving around  Mobility: Walking and Moving Around Current Status (T0931) CK  Mobility: Walking and Moving Around Goal Status [phone]) CI  Mobility: Walking and Moving Around Discharge Status [phone]) CK    Roney Marion, PT  Acute Rehabilitation Services Pager [phone] Office [phone]

## 2015-01-03 NOTE — Progress Notes (Signed)
OT Note - Addendum    Dec 27, 2014 1010  OT Visit Information  Last OT Received On 27-Dec-2014  OT G-codes **NOT FOR INPATIENT CLASS**  Functional Assessment Tool Used clinical judgement  Functional Limitation Self care  Self Care Current Status [phone]) CM  Self Care Goal Status [phone]) Browning, OTR/L  [phone] Dec 27, 2014

## 2015-01-04 DIAGNOSIS — M545 Low back pain: Secondary | ICD-10-CM | POA: Diagnosis not present

## 2015-01-04 DIAGNOSIS — Z471 Aftercare following joint replacement surgery: Secondary | ICD-10-CM | POA: Diagnosis not present

## 2015-01-04 DIAGNOSIS — M810 Age-related osteoporosis without current pathological fracture: Secondary | ICD-10-CM | POA: Diagnosis not present

## 2015-01-04 DIAGNOSIS — M6281 Muscle weakness (generalized): Secondary | ICD-10-CM | POA: Diagnosis not present

## 2015-01-04 DIAGNOSIS — G8929 Other chronic pain: Secondary | ICD-10-CM | POA: Diagnosis not present

## 2015-01-04 DIAGNOSIS — I10 Essential (primary) hypertension: Secondary | ICD-10-CM | POA: Diagnosis not present

## 2015-01-05 DIAGNOSIS — M810 Age-related osteoporosis without current pathological fracture: Secondary | ICD-10-CM | POA: Diagnosis not present

## 2015-01-05 DIAGNOSIS — I10 Essential (primary) hypertension: Secondary | ICD-10-CM | POA: Diagnosis not present

## 2015-01-05 DIAGNOSIS — G8929 Other chronic pain: Secondary | ICD-10-CM | POA: Diagnosis not present

## 2015-01-05 DIAGNOSIS — M6281 Muscle weakness (generalized): Secondary | ICD-10-CM | POA: Diagnosis not present

## 2015-01-05 DIAGNOSIS — M545 Low back pain: Secondary | ICD-10-CM | POA: Diagnosis not present

## 2015-01-05 DIAGNOSIS — Z471 Aftercare following joint replacement surgery: Secondary | ICD-10-CM | POA: Diagnosis not present

## 2015-01-06 DIAGNOSIS — M6281 Muscle weakness (generalized): Secondary | ICD-10-CM | POA: Diagnosis not present

## 2015-01-06 DIAGNOSIS — G8929 Other chronic pain: Secondary | ICD-10-CM | POA: Diagnosis not present

## 2015-01-06 DIAGNOSIS — I10 Essential (primary) hypertension: Secondary | ICD-10-CM | POA: Diagnosis not present

## 2015-01-06 DIAGNOSIS — M810 Age-related osteoporosis without current pathological fracture: Secondary | ICD-10-CM | POA: Diagnosis not present

## 2015-01-06 DIAGNOSIS — M545 Low back pain: Secondary | ICD-10-CM | POA: Diagnosis not present

## 2015-01-06 DIAGNOSIS — Z471 Aftercare following joint replacement surgery: Secondary | ICD-10-CM | POA: Diagnosis not present

## 2015-01-09 DIAGNOSIS — M6281 Muscle weakness (generalized): Secondary | ICD-10-CM | POA: Diagnosis not present

## 2015-01-09 DIAGNOSIS — M810 Age-related osteoporosis without current pathological fracture: Secondary | ICD-10-CM | POA: Diagnosis not present

## 2015-01-09 DIAGNOSIS — M545 Low back pain: Secondary | ICD-10-CM | POA: Diagnosis not present

## 2015-01-09 DIAGNOSIS — Z471 Aftercare following joint replacement surgery: Secondary | ICD-10-CM | POA: Diagnosis not present

## 2015-01-09 DIAGNOSIS — I10 Essential (primary) hypertension: Secondary | ICD-10-CM | POA: Diagnosis not present

## 2015-01-09 DIAGNOSIS — G8929 Other chronic pain: Secondary | ICD-10-CM | POA: Diagnosis not present

## 2015-01-10 DIAGNOSIS — Z471 Aftercare following joint replacement surgery: Secondary | ICD-10-CM | POA: Diagnosis not present

## 2015-01-10 DIAGNOSIS — I10 Essential (primary) hypertension: Secondary | ICD-10-CM | POA: Diagnosis not present

## 2015-01-10 DIAGNOSIS — M810 Age-related osteoporosis without current pathological fracture: Secondary | ICD-10-CM | POA: Diagnosis not present

## 2015-01-10 DIAGNOSIS — M545 Low back pain: Secondary | ICD-10-CM | POA: Diagnosis not present

## 2015-01-10 DIAGNOSIS — G8929 Other chronic pain: Secondary | ICD-10-CM | POA: Diagnosis not present

## 2015-01-10 DIAGNOSIS — M6281 Muscle weakness (generalized): Secondary | ICD-10-CM | POA: Diagnosis not present

## 2015-01-11 DIAGNOSIS — G8929 Other chronic pain: Secondary | ICD-10-CM | POA: Diagnosis not present

## 2015-01-11 DIAGNOSIS — M6281 Muscle weakness (generalized): Secondary | ICD-10-CM | POA: Diagnosis not present

## 2015-01-11 DIAGNOSIS — M810 Age-related osteoporosis without current pathological fracture: Secondary | ICD-10-CM | POA: Diagnosis not present

## 2015-01-11 DIAGNOSIS — M545 Low back pain: Secondary | ICD-10-CM | POA: Diagnosis not present

## 2015-01-11 DIAGNOSIS — Z471 Aftercare following joint replacement surgery: Secondary | ICD-10-CM | POA: Diagnosis not present

## 2015-01-11 DIAGNOSIS — I10 Essential (primary) hypertension: Secondary | ICD-10-CM | POA: Diagnosis not present

## 2015-01-12 DIAGNOSIS — Z471 Aftercare following joint replacement surgery: Secondary | ICD-10-CM | POA: Diagnosis not present

## 2015-01-12 DIAGNOSIS — G8929 Other chronic pain: Secondary | ICD-10-CM | POA: Diagnosis not present

## 2015-01-12 DIAGNOSIS — M6281 Muscle weakness (generalized): Secondary | ICD-10-CM | POA: Diagnosis not present

## 2015-01-12 DIAGNOSIS — M810 Age-related osteoporosis without current pathological fracture: Secondary | ICD-10-CM | POA: Diagnosis not present

## 2015-01-12 DIAGNOSIS — I10 Essential (primary) hypertension: Secondary | ICD-10-CM | POA: Diagnosis not present

## 2015-01-12 DIAGNOSIS — M545 Low back pain: Secondary | ICD-10-CM | POA: Diagnosis not present

## 2015-01-16 DIAGNOSIS — M6281 Muscle weakness (generalized): Secondary | ICD-10-CM | POA: Diagnosis not present

## 2015-01-16 DIAGNOSIS — M545 Low back pain: Secondary | ICD-10-CM | POA: Diagnosis not present

## 2015-01-16 DIAGNOSIS — Z471 Aftercare following joint replacement surgery: Secondary | ICD-10-CM | POA: Diagnosis not present

## 2015-01-16 DIAGNOSIS — M810 Age-related osteoporosis without current pathological fracture: Secondary | ICD-10-CM | POA: Diagnosis not present

## 2015-01-16 DIAGNOSIS — I10 Essential (primary) hypertension: Secondary | ICD-10-CM | POA: Diagnosis not present

## 2015-01-16 DIAGNOSIS — G8929 Other chronic pain: Secondary | ICD-10-CM | POA: Diagnosis not present

## 2015-01-17 DIAGNOSIS — M810 Age-related osteoporosis without current pathological fracture: Secondary | ICD-10-CM | POA: Diagnosis not present

## 2015-01-17 DIAGNOSIS — G8929 Other chronic pain: Secondary | ICD-10-CM | POA: Diagnosis not present

## 2015-01-17 DIAGNOSIS — M545 Low back pain: Secondary | ICD-10-CM | POA: Diagnosis not present

## 2015-01-17 DIAGNOSIS — M6281 Muscle weakness (generalized): Secondary | ICD-10-CM | POA: Diagnosis not present

## 2015-01-17 DIAGNOSIS — Z471 Aftercare following joint replacement surgery: Secondary | ICD-10-CM | POA: Diagnosis not present

## 2015-01-17 DIAGNOSIS — I10 Essential (primary) hypertension: Secondary | ICD-10-CM | POA: Diagnosis not present

## 2015-01-18 DIAGNOSIS — Z471 Aftercare following joint replacement surgery: Secondary | ICD-10-CM | POA: Diagnosis not present

## 2015-01-18 DIAGNOSIS — M545 Low back pain: Secondary | ICD-10-CM | POA: Diagnosis not present

## 2015-01-18 DIAGNOSIS — M810 Age-related osteoporosis without current pathological fracture: Secondary | ICD-10-CM | POA: Diagnosis not present

## 2015-01-18 DIAGNOSIS — M6281 Muscle weakness (generalized): Secondary | ICD-10-CM | POA: Diagnosis not present

## 2015-01-18 DIAGNOSIS — G8929 Other chronic pain: Secondary | ICD-10-CM | POA: Diagnosis not present

## 2015-01-18 DIAGNOSIS — I10 Essential (primary) hypertension: Secondary | ICD-10-CM | POA: Diagnosis not present

## 2015-01-20 DIAGNOSIS — Z471 Aftercare following joint replacement surgery: Secondary | ICD-10-CM | POA: Diagnosis not present

## 2015-01-20 DIAGNOSIS — M545 Low back pain: Secondary | ICD-10-CM | POA: Diagnosis not present

## 2015-01-20 DIAGNOSIS — I10 Essential (primary) hypertension: Secondary | ICD-10-CM | POA: Diagnosis not present

## 2015-01-20 DIAGNOSIS — M6281 Muscle weakness (generalized): Secondary | ICD-10-CM | POA: Diagnosis not present

## 2015-01-20 DIAGNOSIS — M810 Age-related osteoporosis without current pathological fracture: Secondary | ICD-10-CM | POA: Diagnosis not present

## 2015-01-20 DIAGNOSIS — G8929 Other chronic pain: Secondary | ICD-10-CM | POA: Diagnosis not present

## 2015-01-25 DIAGNOSIS — M545 Low back pain: Secondary | ICD-10-CM | POA: Diagnosis not present

## 2015-01-25 DIAGNOSIS — M6281 Muscle weakness (generalized): Secondary | ICD-10-CM | POA: Diagnosis not present

## 2015-01-25 DIAGNOSIS — G8929 Other chronic pain: Secondary | ICD-10-CM | POA: Diagnosis not present

## 2015-01-25 DIAGNOSIS — I10 Essential (primary) hypertension: Secondary | ICD-10-CM | POA: Diagnosis not present

## 2015-01-25 DIAGNOSIS — Z471 Aftercare following joint replacement surgery: Secondary | ICD-10-CM | POA: Diagnosis not present

## 2015-01-25 DIAGNOSIS — M810 Age-related osteoporosis without current pathological fracture: Secondary | ICD-10-CM | POA: Diagnosis not present

## 2015-01-27 DIAGNOSIS — M545 Low back pain: Secondary | ICD-10-CM | POA: Diagnosis not present

## 2015-01-27 DIAGNOSIS — M810 Age-related osteoporosis without current pathological fracture: Secondary | ICD-10-CM | POA: Diagnosis not present

## 2015-01-27 DIAGNOSIS — Z471 Aftercare following joint replacement surgery: Secondary | ICD-10-CM | POA: Diagnosis not present

## 2015-01-27 DIAGNOSIS — M6281 Muscle weakness (generalized): Secondary | ICD-10-CM | POA: Diagnosis not present

## 2015-01-27 DIAGNOSIS — I10 Essential (primary) hypertension: Secondary | ICD-10-CM | POA: Diagnosis not present

## 2015-01-27 DIAGNOSIS — G8929 Other chronic pain: Secondary | ICD-10-CM | POA: Diagnosis not present

## 2015-01-29 DIAGNOSIS — I10 Essential (primary) hypertension: Secondary | ICD-10-CM | POA: Diagnosis not present

## 2015-01-29 DIAGNOSIS — Z471 Aftercare following joint replacement surgery: Secondary | ICD-10-CM | POA: Diagnosis not present

## 2015-01-29 DIAGNOSIS — M6281 Muscle weakness (generalized): Secondary | ICD-10-CM | POA: Diagnosis not present

## 2015-01-29 DIAGNOSIS — M545 Low back pain: Secondary | ICD-10-CM | POA: Diagnosis not present

## 2015-01-29 DIAGNOSIS — G8929 Other chronic pain: Secondary | ICD-10-CM | POA: Diagnosis not present

## 2015-01-29 DIAGNOSIS — M810 Age-related osteoporosis without current pathological fracture: Secondary | ICD-10-CM | POA: Diagnosis not present

## 2015-01-30 DIAGNOSIS — S42291D Other displaced fracture of upper end of right humerus, subsequent encounter for fracture with routine healing: Secondary | ICD-10-CM | POA: Diagnosis not present

## 2015-01-31 DIAGNOSIS — I10 Essential (primary) hypertension: Secondary | ICD-10-CM | POA: Diagnosis not present

## 2015-01-31 DIAGNOSIS — M6281 Muscle weakness (generalized): Secondary | ICD-10-CM | POA: Diagnosis not present

## 2015-01-31 DIAGNOSIS — M545 Low back pain: Secondary | ICD-10-CM | POA: Diagnosis not present

## 2015-01-31 DIAGNOSIS — G8929 Other chronic pain: Secondary | ICD-10-CM | POA: Diagnosis not present

## 2015-01-31 DIAGNOSIS — Z471 Aftercare following joint replacement surgery: Secondary | ICD-10-CM | POA: Diagnosis not present

## 2015-01-31 DIAGNOSIS — M810 Age-related osteoporosis without current pathological fracture: Secondary | ICD-10-CM | POA: Diagnosis not present

## 2015-02-01 DIAGNOSIS — Z471 Aftercare following joint replacement surgery: Secondary | ICD-10-CM | POA: Diagnosis not present

## 2015-02-01 DIAGNOSIS — G8929 Other chronic pain: Secondary | ICD-10-CM | POA: Diagnosis not present

## 2015-02-01 DIAGNOSIS — I10 Essential (primary) hypertension: Secondary | ICD-10-CM | POA: Diagnosis not present

## 2015-02-01 DIAGNOSIS — M6281 Muscle weakness (generalized): Secondary | ICD-10-CM | POA: Diagnosis not present

## 2015-02-01 DIAGNOSIS — M810 Age-related osteoporosis without current pathological fracture: Secondary | ICD-10-CM | POA: Diagnosis not present

## 2015-02-01 DIAGNOSIS — M545 Low back pain: Secondary | ICD-10-CM | POA: Diagnosis not present

## 2015-02-02 DIAGNOSIS — Z471 Aftercare following joint replacement surgery: Secondary | ICD-10-CM | POA: Diagnosis not present

## 2015-02-02 DIAGNOSIS — M545 Low back pain: Secondary | ICD-10-CM | POA: Diagnosis not present

## 2015-02-02 DIAGNOSIS — I10 Essential (primary) hypertension: Secondary | ICD-10-CM | POA: Diagnosis not present

## 2015-02-02 DIAGNOSIS — G8929 Other chronic pain: Secondary | ICD-10-CM | POA: Diagnosis not present

## 2015-02-02 DIAGNOSIS — M6281 Muscle weakness (generalized): Secondary | ICD-10-CM | POA: Diagnosis not present

## 2015-02-02 DIAGNOSIS — M810 Age-related osteoporosis without current pathological fracture: Secondary | ICD-10-CM | POA: Diagnosis not present

## 2015-02-03 DIAGNOSIS — I10 Essential (primary) hypertension: Secondary | ICD-10-CM | POA: Diagnosis not present

## 2015-02-03 DIAGNOSIS — M545 Low back pain: Secondary | ICD-10-CM | POA: Diagnosis not present

## 2015-02-03 DIAGNOSIS — M810 Age-related osteoporosis without current pathological fracture: Secondary | ICD-10-CM | POA: Diagnosis not present

## 2015-02-03 DIAGNOSIS — Z471 Aftercare following joint replacement surgery: Secondary | ICD-10-CM | POA: Diagnosis not present

## 2015-02-03 DIAGNOSIS — M6281 Muscle weakness (generalized): Secondary | ICD-10-CM | POA: Diagnosis not present

## 2015-02-03 DIAGNOSIS — G8929 Other chronic pain: Secondary | ICD-10-CM | POA: Diagnosis not present

## 2015-02-07 DIAGNOSIS — G8929 Other chronic pain: Secondary | ICD-10-CM | POA: Diagnosis not present

## 2015-02-07 DIAGNOSIS — M6281 Muscle weakness (generalized): Secondary | ICD-10-CM | POA: Diagnosis not present

## 2015-02-07 DIAGNOSIS — M545 Low back pain: Secondary | ICD-10-CM | POA: Diagnosis not present

## 2015-02-07 DIAGNOSIS — Z471 Aftercare following joint replacement surgery: Secondary | ICD-10-CM | POA: Diagnosis not present

## 2015-02-07 DIAGNOSIS — I10 Essential (primary) hypertension: Secondary | ICD-10-CM | POA: Diagnosis not present

## 2015-02-07 DIAGNOSIS — M810 Age-related osteoporosis without current pathological fracture: Secondary | ICD-10-CM | POA: Diagnosis not present

## 2015-02-08 DIAGNOSIS — M545 Low back pain: Secondary | ICD-10-CM | POA: Diagnosis not present

## 2015-02-08 DIAGNOSIS — M810 Age-related osteoporosis without current pathological fracture: Secondary | ICD-10-CM | POA: Diagnosis not present

## 2015-02-08 DIAGNOSIS — G8929 Other chronic pain: Secondary | ICD-10-CM | POA: Diagnosis not present

## 2015-02-08 DIAGNOSIS — Z471 Aftercare following joint replacement surgery: Secondary | ICD-10-CM | POA: Diagnosis not present

## 2015-02-08 DIAGNOSIS — I10 Essential (primary) hypertension: Secondary | ICD-10-CM | POA: Diagnosis not present

## 2015-02-08 DIAGNOSIS — M6281 Muscle weakness (generalized): Secondary | ICD-10-CM | POA: Diagnosis not present

## 2015-02-09 DIAGNOSIS — I10 Essential (primary) hypertension: Secondary | ICD-10-CM | POA: Diagnosis not present

## 2015-02-09 DIAGNOSIS — M545 Low back pain: Secondary | ICD-10-CM | POA: Diagnosis not present

## 2015-02-09 DIAGNOSIS — G8929 Other chronic pain: Secondary | ICD-10-CM | POA: Diagnosis not present

## 2015-02-09 DIAGNOSIS — Z471 Aftercare following joint replacement surgery: Secondary | ICD-10-CM | POA: Diagnosis not present

## 2015-02-09 DIAGNOSIS — M810 Age-related osteoporosis without current pathological fracture: Secondary | ICD-10-CM | POA: Diagnosis not present

## 2015-02-09 DIAGNOSIS — M6281 Muscle weakness (generalized): Secondary | ICD-10-CM | POA: Diagnosis not present

## 2015-02-14 DIAGNOSIS — M810 Age-related osteoporosis without current pathological fracture: Secondary | ICD-10-CM | POA: Diagnosis not present

## 2015-02-14 DIAGNOSIS — G8929 Other chronic pain: Secondary | ICD-10-CM | POA: Diagnosis not present

## 2015-02-14 DIAGNOSIS — Z471 Aftercare following joint replacement surgery: Secondary | ICD-10-CM | POA: Diagnosis not present

## 2015-02-14 DIAGNOSIS — M6281 Muscle weakness (generalized): Secondary | ICD-10-CM | POA: Diagnosis not present

## 2015-02-14 DIAGNOSIS — M545 Low back pain: Secondary | ICD-10-CM | POA: Diagnosis not present

## 2015-02-14 DIAGNOSIS — I10 Essential (primary) hypertension: Secondary | ICD-10-CM | POA: Diagnosis not present

## 2015-02-17 DIAGNOSIS — M810 Age-related osteoporosis without current pathological fracture: Secondary | ICD-10-CM | POA: Diagnosis not present

## 2015-02-17 DIAGNOSIS — M6281 Muscle weakness (generalized): Secondary | ICD-10-CM | POA: Diagnosis not present

## 2015-02-17 DIAGNOSIS — M545 Low back pain: Secondary | ICD-10-CM | POA: Diagnosis not present

## 2015-02-17 DIAGNOSIS — I10 Essential (primary) hypertension: Secondary | ICD-10-CM | POA: Diagnosis not present

## 2015-02-17 DIAGNOSIS — Z471 Aftercare following joint replacement surgery: Secondary | ICD-10-CM | POA: Diagnosis not present

## 2015-02-17 DIAGNOSIS — G8929 Other chronic pain: Secondary | ICD-10-CM | POA: Diagnosis not present

## 2015-02-27 DIAGNOSIS — S42291D Other displaced fracture of upper end of right humerus, subsequent encounter for fracture with routine healing: Secondary | ICD-10-CM | POA: Diagnosis not present

## 2015-03-17 DIAGNOSIS — L570 Actinic keratosis: Secondary | ICD-10-CM | POA: Diagnosis not present

## 2015-03-17 DIAGNOSIS — L814 Other melanin hyperpigmentation: Secondary | ICD-10-CM | POA: Diagnosis not present

## 2015-03-17 DIAGNOSIS — L578 Other skin changes due to chronic exposure to nonionizing radiation: Secondary | ICD-10-CM | POA: Diagnosis not present

## 2015-04-05 DIAGNOSIS — S42291D Other displaced fracture of upper end of right humerus, subsequent encounter for fracture with routine healing: Secondary | ICD-10-CM | POA: Diagnosis not present

## 2015-06-02 DIAGNOSIS — B372 Candidiasis of skin and nail: Secondary | ICD-10-CM | POA: Diagnosis not present

## 2015-07-28 DIAGNOSIS — L814 Other melanin hyperpigmentation: Secondary | ICD-10-CM | POA: Diagnosis not present

## 2015-07-28 DIAGNOSIS — L578 Other skin changes due to chronic exposure to nonionizing radiation: Secondary | ICD-10-CM | POA: Diagnosis not present

## 2015-07-28 DIAGNOSIS — L304 Erythema intertrigo: Secondary | ICD-10-CM | POA: Diagnosis not present

## 2015-09-28 DIAGNOSIS — I10 Essential (primary) hypertension: Secondary | ICD-10-CM | POA: Diagnosis not present

## 2015-09-28 DIAGNOSIS — D696 Thrombocytopenia, unspecified: Secondary | ICD-10-CM | POA: Diagnosis not present

## 2015-09-28 DIAGNOSIS — E039 Hypothyroidism, unspecified: Secondary | ICD-10-CM | POA: Diagnosis not present

## 2015-09-28 DIAGNOSIS — Z79899 Other long term (current) drug therapy: Secondary | ICD-10-CM | POA: Diagnosis not present

## 2015-11-22 DIAGNOSIS — L0100 Impetigo, unspecified: Secondary | ICD-10-CM | POA: Diagnosis not present

## 2015-11-22 DIAGNOSIS — L304 Erythema intertrigo: Secondary | ICD-10-CM | POA: Diagnosis not present

## 2015-12-06 DIAGNOSIS — Z961 Presence of intraocular lens: Secondary | ICD-10-CM | POA: Diagnosis not present

## 2015-12-06 DIAGNOSIS — H353131 Nonexudative age-related macular degeneration, bilateral, early dry stage: Secondary | ICD-10-CM | POA: Diagnosis not present

## 2016-06-19 DIAGNOSIS — H6121 Impacted cerumen, right ear: Secondary | ICD-10-CM | POA: Diagnosis not present

## 2016-07-10 DIAGNOSIS — X32XXXA Exposure to sunlight, initial encounter: Secondary | ICD-10-CM | POA: Diagnosis not present

## 2016-07-10 DIAGNOSIS — L820 Inflamed seborrheic keratosis: Secondary | ICD-10-CM | POA: Diagnosis not present

## 2016-07-10 DIAGNOSIS — L570 Actinic keratosis: Secondary | ICD-10-CM | POA: Diagnosis not present

## 2016-07-10 DIAGNOSIS — D0439 Carcinoma in situ of skin of other parts of face: Secondary | ICD-10-CM | POA: Diagnosis not present

## 2016-08-19 DIAGNOSIS — S81801S Unspecified open wound, right lower leg, sequela: Secondary | ICD-10-CM | POA: Diagnosis not present

## 2016-08-19 DIAGNOSIS — L539 Erythematous condition, unspecified: Secondary | ICD-10-CM | POA: Diagnosis not present
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
## 2017-01-01 IMAGING — CR DG SHOULDER 1V*R*
1 series · 1 of 1 positions shown · non-contrast
Comparison: 12/01/2014

CLINICAL DATA: Comminuted right shoulder fracture.

EXAM:
RIGHT SHOULDER - 1 VIEW

[AP]
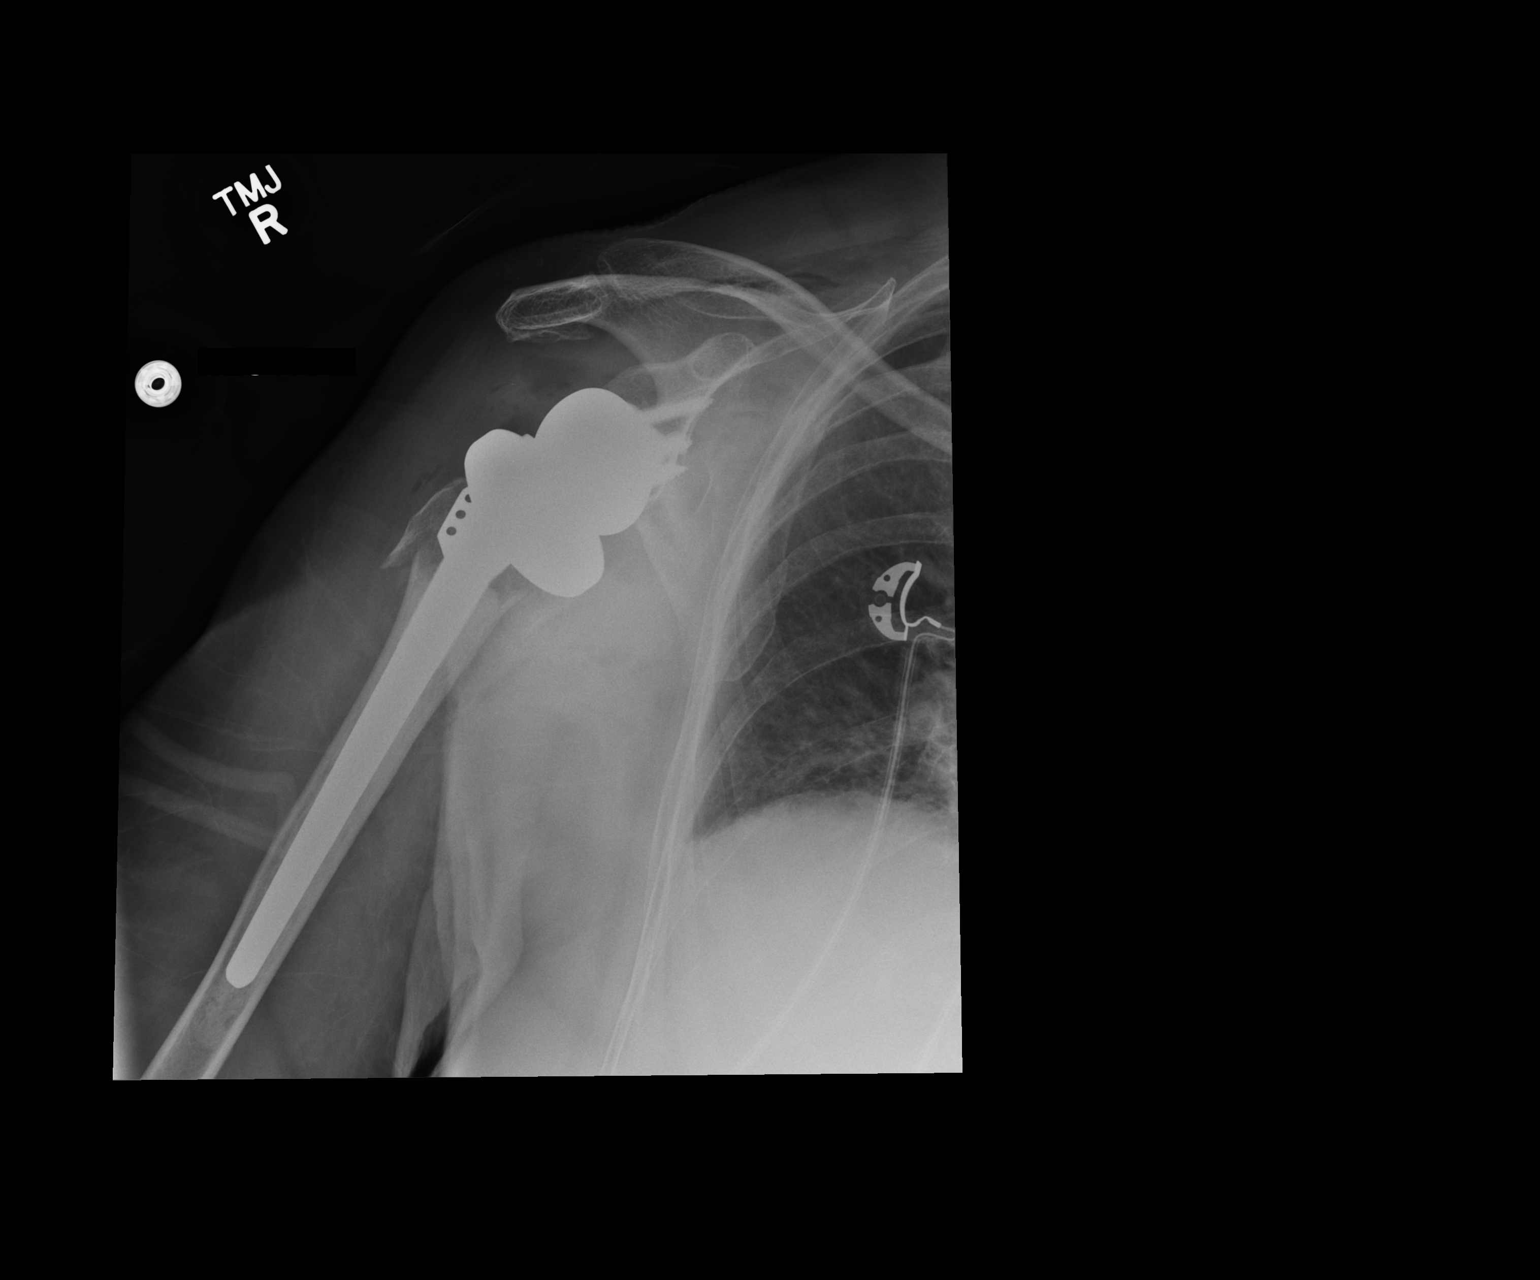

[1 of 1 positions shown; findings below may reference images not displayed]

FINDINGS: Single view of the right shoulder demonstrates a total shoulder
arthroplasty. There is a displaced bone fragment in the proximal
humerus. Scattered areas of soft tissue lucency compatible with
recent surgery. No gross abnormality to the prosthesis.
IMPRESSION: Placement of a right total shoulder arthroplasty. Displaced bone
fragment along the proximal prosthesis is probably related to the
prior fracture.

## 2017-07-18 DIAGNOSIS — Z79899 Other long term (current) drug therapy: Secondary | ICD-10-CM | POA: Diagnosis not present

## 2017-07-18 DIAGNOSIS — D696 Thrombocytopenia, unspecified: Secondary | ICD-10-CM | POA: Diagnosis not present

## 2017-07-18 DIAGNOSIS — E039 Hypothyroidism, unspecified: Secondary | ICD-10-CM | POA: Diagnosis not present

## 2017-07-18 DIAGNOSIS — I10 Essential (primary) hypertension: Secondary | ICD-10-CM | POA: Diagnosis not present

## 2017-07-18 DIAGNOSIS — Z23 Encounter for immunization: Secondary | ICD-10-CM | POA: Diagnosis not present

## 2017-08-26 ENCOUNTER — Telehealth: Payer: Self-pay | Admitting: *Deleted

## 2017-08-26 NOTE — Telephone Encounter (Signed)
Medical records were received from [facility] on 08-25-17. Medical Records will be sent over to [facility] Dr. Cathlean Cower through in-house mail.

## 2017-09-05 ENCOUNTER — Other Ambulatory Visit (INDEPENDENT_AMBULATORY_CARE_PROVIDER_SITE_OTHER): Payer: Medicare Other

## 2017-09-05 ENCOUNTER — Encounter: Payer: Self-pay | Admitting: Internal Medicine

## 2017-09-05 ENCOUNTER — Ambulatory Visit (INDEPENDENT_AMBULATORY_CARE_PROVIDER_SITE_OTHER): Payer: Medicare Other | Admitting: Internal Medicine

## 2017-09-05 VITALS — BP 128/76 | HR 75 | Temp 98.0°F | Ht 62.0 in | Wt 132.0 lb

## 2017-09-05 DIAGNOSIS — R739 Hyperglycemia, unspecified: Secondary | ICD-10-CM

## 2017-09-05 DIAGNOSIS — E559 Vitamin D deficiency, unspecified: Secondary | ICD-10-CM | POA: Diagnosis not present

## 2017-09-05 DIAGNOSIS — R1909 Other intra-abdominal and pelvic swelling, mass and lump: Secondary | ICD-10-CM | POA: Diagnosis not present

## 2017-09-05 DIAGNOSIS — R1900 Intra-abdominal and pelvic swelling, mass and lump, unspecified site: Secondary | ICD-10-CM | POA: Insufficient documentation

## 2017-09-05 DIAGNOSIS — D649 Anemia, unspecified: Secondary | ICD-10-CM

## 2017-09-05 DIAGNOSIS — J309 Allergic rhinitis, unspecified: Secondary | ICD-10-CM | POA: Insufficient documentation

## 2017-09-05 DIAGNOSIS — Z7189 Other specified counseling: Secondary | ICD-10-CM | POA: Insufficient documentation

## 2017-09-05 DIAGNOSIS — Z0000 Encounter for general adult medical examination without abnormal findings: Secondary | ICD-10-CM | POA: Insufficient documentation

## 2017-09-05 DIAGNOSIS — E538 Deficiency of other specified B group vitamins: Secondary | ICD-10-CM

## 2017-09-05 DIAGNOSIS — E039 Hypothyroidism, unspecified: Secondary | ICD-10-CM | POA: Insufficient documentation

## 2017-09-05 DIAGNOSIS — I10 Essential (primary) hypertension: Secondary | ICD-10-CM | POA: Diagnosis not present

## 2017-09-05 HISTORY — DX: Hyperglycemia, unspecified: R73.9

## 2017-09-05 LAB — CBC WITH DIFFERENTIAL/PLATELET
BASOS ABS: 0.1 10*3/uL (ref 0.0–0.1)
Basophils Relative: 0.8 % (ref 0.0–3.0)
EOS ABS: 0.3 10*3/uL (ref 0.0–0.7)
Eosinophils Relative: 4.2 % (ref 0.0–5.0)
HCT: 41.7 % (ref 36.0–46.0)
Hemoglobin: 13.5 g/dL (ref 12.0–15.0)
LYMPHS ABS: 1.7 10*3/uL (ref 0.7–4.0)
Lymphocytes Relative: 26 % (ref 12.0–46.0)
MCHC: 32.4 g/dL (ref 30.0–36.0)
MCV: 93.8 fl (ref 78.0–100.0)
MONO ABS: 0.7 10*3/uL (ref 0.1–1.0)
Monocytes Relative: 10.2 % (ref 3.0–12.0)
NEUTROS PCT: 58.8 % (ref 43.0–77.0)
Neutro Abs: 3.9 10*3/uL (ref 1.4–7.7)
Platelets: 158 10*3/uL (ref 150.0–400.0)
RBC: 4.45 Mil/uL (ref 3.87–5.11)
RDW: 14.2 % (ref 11.5–15.5)
WBC: 6.6 10*3/uL (ref 4.0–10.5)

## 2017-09-05 LAB — URINALYSIS, ROUTINE W REFLEX MICROSCOPIC
Bilirubin Urine: NEGATIVE
LEUKOCYTES UA: NEGATIVE
Nitrite: NEGATIVE
Specific Gravity, Urine: 1.025 (ref 1.000–1.030)
Total Protein, Urine: NEGATIVE
URINE GLUCOSE: NEGATIVE
UROBILINOGEN UA: 0.2 (ref 0.0–1.0)
pH: 6 (ref 5.0–8.0)

## 2017-09-05 LAB — LIPID PANEL
Cholesterol: 227 mg/dL — ABNORMAL HIGH (ref 0–200)
HDL: 60.1 mg/dL (ref >39.00)
LDL Cholesterol: 128 mg/dL — ABNORMAL HIGH (ref 0–99)
NONHDL: 166.51
TRIGLYCERIDES: 193 mg/dL — AB (ref 0.0–149.0)
Total CHOL/HDL Ratio: 4
VLDL: 38.6 mg/dL (ref 0.0–40.0)

## 2017-09-05 LAB — HEPATIC FUNCTION PANEL
ALBUMIN: 4.2 g/dL (ref 3.5–5.2)
ALT: 12 U/L (ref 0–35)
AST: 15 U/L (ref 0–37)
Alkaline Phosphatase: 54 U/L (ref 39–117)
BILIRUBIN TOTAL: 0.4 mg/dL (ref 0.2–1.2)
Bilirubin, Direct: 0.1 mg/dL (ref 0.0–0.3)
Total Protein: 7.1 g/dL (ref 6.0–8.3)

## 2017-09-05 LAB — BASIC METABOLIC PANEL
BUN: 14 mg/dL (ref 6–23)
CO2: 30 meq/L (ref 19–32)
Calcium: 9.8 mg/dL (ref 8.4–10.5)
Chloride: 104 mEq/L (ref 96–112)
Creatinine, Ser: 0.8 mg/dL (ref 0.40–1.20)
GFR: 71.16 mL/min (ref >60.00)
GLUCOSE: 114 mg/dL — AB (ref 70–99)
POTASSIUM: 3.9 meq/L (ref 3.5–5.1)
SODIUM: 141 meq/L (ref 135–145)

## 2017-09-05 LAB — HEMOGLOBIN A1C: HEMOGLOBIN A1C: 6 % (ref 4.6–6.5)

## 2017-09-05 LAB — VITAMIN D 25 HYDROXY (VIT D DEFICIENCY, FRACTURES): VITD: 20.4 ng/mL — AB (ref 30.00–100.00)

## 2017-09-05 LAB — VITAMIN B12: Vitamin B-12: 393 pg/mL (ref 211–911)

## 2017-09-05 LAB — TSH: TSH: 1.28 u[IU]/mL (ref 0.35–4.50)

## 2017-09-05 LAB — IBC PANEL
Iron: 58 ug/dL (ref 42–145)
Saturation Ratios: 15.5 % — ABNORMAL LOW (ref 20.0–50.0)
TRANSFERRIN: 268 mg/dL (ref 212.0–360.0)

## 2017-09-05 MED ORDER — AMLODIPINE BESYLATE 2.5 MG PO TABS: 2.5000 mg | ORAL_TABLET | Freq: Every day | ORAL | 3 refills | Status: DC

## 2017-09-05 MED ORDER — LEVOTHYROXINE SODIUM 50 MCG PO TABS: 50.0000 ug | ORAL_TABLET | ORAL | 3 refills | Status: DC

## 2017-09-05 NOTE — Patient Instructions (Addendum)
Please continue all other medications as before, and refills have been done if requested.  Please have the pharmacy call with any other refills you may need.  Please continue your efforts at being more active, low cholesterol diet, and weight control.  You are otherwise up to date with prevention measures today.  Please keep your appointments with your specialists as you may have planned  Please call if you would want the Pelvic Ultrasound, and TransVaginal Ultrasound to check the lower abdominal mass  Please go to the LAB in the Basement (turn left off the elevator) for the tests to be done today  You will be contacted by phone if any changes need to be made immediately.  Otherwise, you will receive a letter about your results with an explanation, but please check with MyChart first.  Please remember to sign up for MyChart if you have not done so, as this will be important to you in the future with finding out test results, communicating by private email, and scheduling acute appointments online when needed.  Please return in 6 months, or sooner if needed

## 2017-09-05 NOTE — Progress Notes (Signed)
Subjective:    Patient ID: Emily Spence, female    DOB: Jan 26, 1922, 83 y.o.   MRN: [id number]  HPI  Here to establish with son in transfer of PCP as her previous no longer visits her assisted living facility;  Overall doing ok;  Pt denies Chest pain, worsening SOB, DOE, wheezing, orthopnea, PND, worsening LE edema, palpitations, dizziness or syncope.  Pt denies neurological change such as new headache, facial or extremity weakness.  Pt denies polydipsia, polyuria, or low sugar symptoms. Pt states overall good compliance with treatment and medications, good tolerability, and has been trying to follow appropriate diet.  Pt denies worsening depressive symptoms, suicidal ideation or panic. No fever, night sweats, wt loss, loss of appetite, or other constitutional symptoms.  Pt states good ability with ADL's, has low fall risk, home safety reviewed and adequate, no other significant recent changes in hearing or vision, and not active with exercise, but is ambulatory well at the facility with cane, and overall seems quite delightful today.  Declines tetanus and pneumonia. Denies hyper or hypo thyroid symptoms such as voice, skin or hair change.  No specific complaints or interval hx Past Medical History:  Diagnosis Date  . Allergic rhinitis   . Anemia   . Closed 4-part fracture of proximal end of right humerus 12/01/2014  . Closed compression fracture of L1 lumbar vertebra ([place])   . Female cystocele   . HLD (hyperlipidemia)   . Hyperglycemia 09/05/2017  . Hypertension   . Hypothyroidism   . Osteoporosis   . RBBB   . Thyroid disease   . Urge urinary incontinence    Past Surgical History:  Procedure Laterality Date  . APPENDECTOMY    . CATARACT EXTRACTION W/ INTRAOCULAR LENS  IMPLANT, BILATERAL    . TONSILLECTOMY      reports that she has quit smoking. she has never used smokeless tobacco. She reports that she does not drink alcohol or use drugs. family history includes Depression in her  mother. Allergies  Allergen Reactions  . Simvastatin     unknown  . Sulfur     unknown   Current Outpatient Medications on File Prior to Visit  Medication Sig Dispense Refill  . aspirin 81 MG chewable tablet Chew 81 mg by mouth daily.     No current facility-administered medications on file prior to visit.    Review of Systems  Constitutional: Negative for other unusual diaphoresis or sweats HENT: Negative for ear discharge or swelling Eyes: Negative for other worsening visual disturbances Respiratory: Negative for stridor or other swelling  Gastrointestinal: Negative for worsening distension or other blood Genitourinary: Negative for retention or other urinary change Musculoskeletal: Negative for other MSK pain or swelling Skin: Negative for color change or other new lesions Neurological: Negative for worsening tremors and other numbness  Psychiatric/Behavioral: Negative for worsening agitation or other fatigue No other exam findings    Objective:   Physical Exam BP 128/76   Pulse 75   Temp 98 F (36.7 C) (Oral)   Ht 5\' 2"  (1.575 m)   Wt 132 lb (59.9 kg)   SpO2 99%   BMI 24.14 kg/m  Walks with cane, able to get up on exam table VS noted,  Constitutional: Pt appears in NAD HENT: Head: NCAT.  Right Ear: External ear normal.  Left Ear: External ear normal.  Eyes: . Pupils are equal, round, and reactive to light. Conjunctivae and EOM are normal Nose: without d/c or deformity Neck: Neck supple. Johney Maine  normal ROM Cardiovascular: Normal rate and regular rhythm.   Pulmonary/Chest: Effort normal and breath sounds without rales or wheezing.  Abd:  Soft, NT, ND, + BS, no organomegaly but has a firm/hard mass like abnormality in the low mid abdomen without other lymphadenopathy groin Neurological: Pt is alert. At baseline orientation, motor grossly intact Skin: Skin is warm. No rashes, other new lesions, no LE edema Psychiatric: Pt behavior is normal without agitation  No  other exam findings    Assessment & Plan:

## 2017-09-07 ENCOUNTER — Encounter: Payer: Self-pay | Admitting: Internal Medicine

## 2017-09-07 DIAGNOSIS — E785 Hyperlipidemia, unspecified: Secondary | ICD-10-CM | POA: Insufficient documentation

## 2017-09-07 DIAGNOSIS — S32010A Wedge compression fracture of first lumbar vertebra, initial encounter for closed fracture: Secondary | ICD-10-CM | POA: Insufficient documentation

## 2017-09-07 DIAGNOSIS — I4510 Unspecified right bundle-branch block: Secondary | ICD-10-CM | POA: Insufficient documentation

## 2017-09-07 DIAGNOSIS — N8110 Cystocele, unspecified: Secondary | ICD-10-CM | POA: Insufficient documentation

## 2017-09-07 DIAGNOSIS — M810 Age-related osteoporosis without current pathological fracture: Secondary | ICD-10-CM | POA: Insufficient documentation

## 2017-09-07 DIAGNOSIS — N3941 Urge incontinence: Secondary | ICD-10-CM | POA: Insufficient documentation

## 2017-09-07 NOTE — Assessment & Plan Note (Signed)
stable overall by history and exam, recent data reviewed with pt, and pt to continue medical treatment as before,  to f/u any worsening symptoms or concerns  

## 2017-09-07 NOTE — Assessment & Plan Note (Signed)
Mild chronic, has declined prior GI evaluation by records, for also check iron, b12

## 2017-09-07 NOTE — Assessment & Plan Note (Signed)
Asympt, for a1c with labs 

## 2017-09-07 NOTE — Assessment & Plan Note (Addendum)
I suspect this is fibroid vs other low abd malignancy, d/w son and pt who declines any further evaluation such as pelvic u/s and transvaginal u/s  Note:  Total time for pt hx, exam, review of record with pt in the room, determination of diagnoses and plan for further eval and tx is > 40 min, with over 50% spent in coordination and counseling of patient and son

## 2017-09-07 NOTE — Assessment & Plan Note (Signed)
Lab Results  Component Value Date   TSH 1.28 09/05/2017  stable overall by history and exam, recent data reviewed with pt, and pt to continue medical treatment as before,  to f/u any worsening symptoms or concerns 

## 2017-09-08 ENCOUNTER — Telehealth: Payer: Self-pay

## 2017-09-08 NOTE — Telephone Encounter (Signed)
-----   Message from Biagio Borg, MD sent at 09/05/2017  8:08 PM EST ----- Letter sent, cont same tx - except  The test results show that your current treatment is OK, except the Vitamin D level is low.  Please start OTC Vitamin D at 2000 units per day.  I will ask the office to call you as well.      Emily Spence to please inform pt's son (david Amick)

## 2017-09-08 NOTE — Telephone Encounter (Signed)
Pt's son has been informed and expressed understanding.    

## 2017-10-09 ENCOUNTER — Emergency Department (HOSPITAL_COMMUNITY): Payer: Medicare Other

## 2017-10-09 ENCOUNTER — Encounter: Payer: Self-pay | Admitting: Family

## 2017-10-09 ENCOUNTER — Ambulatory Visit (INDEPENDENT_AMBULATORY_CARE_PROVIDER_SITE_OTHER): Payer: Medicare Other | Admitting: Family

## 2017-10-09 ENCOUNTER — Encounter (HOSPITAL_COMMUNITY): Payer: Self-pay | Admitting: Emergency Medicine

## 2017-10-09 ENCOUNTER — Ambulatory Visit: Payer: Medicare Other | Admitting: Nurse Practitioner

## 2017-10-09 ENCOUNTER — Emergency Department (HOSPITAL_COMMUNITY)
Admission: EM | Admit: 2017-10-09 | Discharge: 2017-10-09 | Disposition: A | Discharge status: Home / Self Care | Payer: Medicare Other | Attending: Emergency Medicine | Admitting: Emergency Medicine

## 2017-10-09 VITALS — BP 140/84 | HR 81 | Temp 97.5°F | Ht 62.0 in | Wt 131.0 lb

## 2017-10-09 DIAGNOSIS — Z79899 Other long term (current) drug therapy: Secondary | ICD-10-CM | POA: Insufficient documentation

## 2017-10-09 DIAGNOSIS — R319 Hematuria, unspecified: Secondary | ICD-10-CM | POA: Diagnosis not present

## 2017-10-09 DIAGNOSIS — Z7982 Long term (current) use of aspirin: Secondary | ICD-10-CM | POA: Diagnosis not present

## 2017-10-09 DIAGNOSIS — E039 Hypothyroidism, unspecified: Secondary | ICD-10-CM | POA: Diagnosis not present

## 2017-10-09 DIAGNOSIS — R1011 Right upper quadrant pain: Secondary | ICD-10-CM

## 2017-10-09 DIAGNOSIS — N390 Urinary tract infection, site not specified: Secondary | ICD-10-CM | POA: Diagnosis not present

## 2017-10-09 DIAGNOSIS — N200 Calculus of kidney: Secondary | ICD-10-CM | POA: Diagnosis not present

## 2017-10-09 DIAGNOSIS — Z87891 Personal history of nicotine dependence: Secondary | ICD-10-CM | POA: Insufficient documentation

## 2017-10-09 LAB — CBC
HCT: 40.8 % (ref 36.0–46.0)
Hemoglobin: 13.5 g/dL (ref 12.0–15.0)
MCH: 30.8 pg (ref 26.0–34.0)
MCHC: 33.1 g/dL (ref 30.0–36.0)
MCV: 92.9 fL (ref 78.0–100.0)
Platelets: 148 10*3/uL — ABNORMAL LOW (ref 150–400)
RBC: 4.39 MIL/uL (ref 3.87–5.11)
RDW: 13.7 % (ref 11.5–15.5)
WBC: 8.3 10*3/uL (ref 4.0–10.5)

## 2017-10-09 LAB — COMPREHENSIVE METABOLIC PANEL
ALT: 15 U/L (ref 14–54)
AST: 23 U/L (ref 15–41)
Albumin: 4.2 g/dL (ref 3.5–5.0)
Alkaline Phosphatase: 66 U/L (ref 38–126)
Anion gap: 8 (ref 5–15)
BUN: 9 mg/dL (ref 6–20)
CO2: 23 mmol/L (ref 22–32)
Calcium: 9.2 mg/dL (ref 8.9–10.3)
Chloride: 105 mmol/L (ref 101–111)
Creatinine, Ser: 0.81 mg/dL (ref 0.44–1.00)
GFR calc Af Amer: >60 mL/min (ref >60)
GFR calc non Af Amer: >60 mL/min (ref >60)
Glucose, Bld: 129 mg/dL — ABNORMAL HIGH (ref 65–99)
Potassium: 3.3 mmol/L — ABNORMAL LOW (ref 3.5–5.1)
Sodium: 136 mmol/L (ref 135–145)
Total Bilirubin: 1.1 mg/dL (ref 0.3–1.2)
Total Protein: 7.4 g/dL (ref 6.5–8.1)

## 2017-10-09 LAB — URINALYSIS, ROUTINE W REFLEX MICROSCOPIC
Bilirubin Urine: NEGATIVE
Glucose, UA: NEGATIVE mg/dL
Ketones, ur: NEGATIVE mg/dL
Nitrite: NEGATIVE
Protein, ur: 30 mg/dL — AB
Specific Gravity, Urine: 1.01 (ref 1.005–1.030)
Squamous Epithelial / LPF: NONE SEEN
pH: 7 (ref 5.0–8.0)

## 2017-10-09 LAB — LIPASE, BLOOD: Lipase: 18 U/L (ref 11–51)

## 2017-10-09 MED ORDER — CEPHALEXIN 500 MG PO CAPS: 500.0000 mg | ORAL_CAPSULE | Freq: Three times a day (TID) | ORAL | 0 refills | Status: DC

## 2017-10-09 MED ORDER — CEPHALEXIN 500 MG PO CAPS
500.0000 mg | ORAL_CAPSULE | Freq: Once | ORAL | Status: AC
  Administered 2017-10-09: 500 mg via ORAL
  Filled 2017-10-09: qty 1

## 2017-10-09 NOTE — ED Triage Notes (Signed)
Patient c/o RUQ pain with n/v after eating over the past 24-36 hours. Sent from [facility] due to age and not able to get in for scan for couple days.

## 2017-10-09 NOTE — ED Provider Notes (Signed)
[facility] DEPT Provider Note   CSN: [id number] Arrival date & time: 10/09/17  1435     History   Chief Complaint Chief Complaint  Patient presents with  . Abdominal Pain  . Emesis    HPI Emily Spence is a 83 y.o. female.  HPI   83 year old female with abdominal pain.  Onset about a day and a half ago.  Right upper quadrant.  Postprandial.  She with nausea.  Does not radiate.  Currently asymptomatic.  Seen by's office prior to arrival and referred to the emergency room for imaging.  No fevers or chills.  No urinary complaints.  No diarrhea.  Abdominal surgical history significant for appendectomy.  Past Medical History:  Diagnosis Date  . Allergic rhinitis   . Anemia   . Closed 4-part fracture of proximal end of right humerus 12/01/2014  . Closed compression fracture of L1 lumbar vertebra ([place])   . Female cystocele   . HLD (hyperlipidemia)   . Hyperglycemia 09/05/2017  . Hypertension   . Hypothyroidism   . Osteoporosis   . RBBB   . Thyroid disease   . Urge urinary incontinence     Patient Active Problem List   Diagnosis Date Noted  . HLD (hyperlipidemia)   . Urge urinary incontinence   . RBBB   . Osteoporosis   . Female cystocele   . Closed compression fracture of L1 lumbar vertebra ([place])   . Hyperglycemia 09/05/2017  . Preventative health care 09/05/2017  . Abdominal mass 09/05/2017  . Anemia 09/05/2017  . Hypertension   . Hypothyroidism   . Allergic rhinitis   . Fracture of humerus, proximal, right, closed 12/09/2014  . Closed 4-part fracture of proximal end of right humerus 12/01/2014    Past Surgical History:  Procedure Laterality Date  . APPENDECTOMY    . CATARACT EXTRACTION W/ INTRAOCULAR LENS  IMPLANT, BILATERAL    . REVERSE SHOULDER ARTHROPLASTY Right 12/09/2014   Procedure: RIGHT REVERSE SHOULDER ARTHROPLASTY;  Surgeon: Johnny Bridge, MD;  Location: [facility];  Service: Orthopedics;  Laterality: Right;  .  TONSILLECTOMY      OB History    No data available       Home Medications    Prior to Admission medications   Medication Sig Start Date End Date Taking? Authorizing Provider  amLODipine (NORVASC) 2.5 MG tablet Take 1 tablet (2.5 mg total) daily by mouth. 09/05/17   Biagio Borg, MD  aspirin 81 MG chewable tablet Chew 81 mg by mouth daily.    [provider]  levothyroxine (SYNTHROID, LEVOTHROID) 50 MCG tablet Take 1 tablet (50 mcg total) See admin instructions by mouth. 1 tab daily except 1&1/2 tab on Tues and Thurs 09/05/17   Biagio Borg, MD    Family History Family History  Problem Relation Age of Onset  . Depression Mother     Social History Social History   Tobacco Use  . Smoking status: Former Research scientist (life sciences)  . Smokeless tobacco: Never Used  Substance Use Topics  . Alcohol use: No  . Drug use: No     Allergies   Simvastatin and Sulfur   Review of Systems Review of Systems   Physical Exam Updated Vital Signs BP (!) 157/93 (BP Location: Left Arm)   Pulse 86   Temp 98.1 F (36.7 C) (Oral)   Resp 18   SpO2 97%   Physical Exam  Constitutional: She appears well-developed and well-nourished. No distress.  HENT:  Head:  Normocephalic and atraumatic.  Eyes: Conjunctivae are normal. Right eye exhibits no discharge. Left eye exhibits no discharge.  Neck: Neck supple.  Cardiovascular: Normal rate, regular rhythm and normal heart sounds. Exam reveals no gallop and no friction rub.  No murmur heard. Pulmonary/Chest: Effort normal and breath sounds normal. No respiratory distress.  Abdominal: Soft. She exhibits no distension. There is no tenderness.  Musculoskeletal: She exhibits no edema or tenderness.  Neurological: She is alert.  Skin: Skin is warm and dry.  Psychiatric: She has a normal mood and affect. Her behavior is normal. Thought content normal.  Nursing note and vitals reviewed.    ED Treatments / Results  Labs (all labs ordered are listed,  but only abnormal results are displayed) Labs Reviewed  COMPREHENSIVE METABOLIC PANEL - Abnormal; Notable for the following components:      Result Value   Potassium 3.3 (*)    Glucose, Bld 129 (*)    All other components within normal limits  CBC - Abnormal; Notable for the following components:   Platelets 148 (*)    All other components within normal limits  URINALYSIS, ROUTINE W REFLEX MICROSCOPIC - Abnormal; Notable for the following components:   Color, Urine AMBER (*)    Hgb urine dipstick LARGE (*)    Protein, ur 30 (*)    Leukocytes, UA SMALL (*)    Bacteria, UA RARE (*)    All other components within normal limits  URINE CULTURE  LIPASE, BLOOD    EKG  EKG Interpretation None       Radiology US Abdomen Limited  Result Date: 10/09/2017 CLINICAL DATA:  Postprandial right upper quadrant pain over the last 2 weeks. EXAM: ULTRASOUND ABDOMEN LIMITED RIGHT UPPER QUADRANT COMPARISON:  CT 02/12/2008 FINDINGS: Gallbladder: No gallstones or wall thickening visualized. No sonographic Murphy sign noted by sonographer. Common bile duct: Diameter: 3.3 mm common normal Liver: Slightly echogenic liver parenchyma suggesting fatty change. No focal lesion or ductal dilatation. Portal vein is patent on color Doppler imaging with normal direction of blood flow towards the liver. IMPRESSION: No evidence of gallstones or ductal obstruction. Echogenic liver suggesting fatty change. Electronically Signed   By: Nelson Chimes M.D.   On: 10/09/2017 18:05   Ct Renal Stone Study  Result Date: 10/09/2017 CLINICAL DATA:  Right upper quadrant pain with nausea vomiting after eating. EXAM: CT ABDOMEN AND PELVIS WITHOUT CONTRAST TECHNIQUE: Multidetector CT imaging of the abdomen and pelvis was performed following the standard protocol without IV contrast. COMPARISON:  02/12/2008. FINDINGS: Lower chest: Small to moderate hiatal hernia. 3 mm right lower lobe pulmonary nodule identified right lower lobe (image  11 series 4). Hepatobiliary: No focal abnormality in the liver on this study without intravenous contrast. There is no evidence for gallstones, gallbladder wall thickening, or pericholecystic fluid. No intrahepatic or extrahepatic biliary dilation. Pancreas: No focal mass lesion. No dilatation of the main duct. No intraparenchymal cyst. No peripancreatic edema. Spleen: No splenomegaly. No focal mass lesion. Adrenals/Urinary Tract: No adrenal nodule or mass. 3 mm nonobstructing stone identified lower pole right kidney. Cortical thinning noted in both kidneys, left greater than right. Central sinus cysts in the kidneys confirmed on prior imaging. Possible 12 mm hyperattenuating soft tissue lesion in the interpolar left kidney (image 26 series 2). No evidence for hydroureter. The urinary bladder appears normal for the degree of distention. Stomach/Bowel: Small to moderate hiatal hernia. Stomach otherwise unremarkable. Duodenum is normally positioned as is the ligament of Treitz. No small bowel wall  thickening. No small bowel dilatation. The terminal ileum is normal. The appendix is not visualized, but there is no edema or inflammation in the region of the cecum. Sigmoid colon diverticulosis noted N although the wall appears somewhat ill-defined on axial imaging, pericolonic edema/ inflammation cannot be confirmed on coronal or sagittal imaging to suggest the presence of acute diverticulitis. Vascular/Lymphatic: There is abdominal aortic atherosclerosis without aneurysm. There is no gastrohepatic or hepatoduodenal ligament lymphadenopathy. No intraperitoneal or retroperitoneal lymphadenopathy. No pelvic sidewall lymphadenopathy. Reproductive: The uterus has normal CT imaging appearance. There is no adnexal mass. Other: No intraperitoneal free fluid. Musculoskeletal: Bone windows reveal no worrisome lytic or sclerotic osseous lesions. IMPRESSION: 1. No acute findings in the abdomen or pelvis. Specifically, no findings to  explain the patient's history of right upper quadrant pain with nausea and vomiting. 2. Small to moderate hiatal hernia. 3. 12 mm possible soft tissue lesion in the interpolar left kidney. MRI without and with contrast would be the study of choice to further evaluate when clinically warranted. This could be performed as an outpatient exam after the patient recovers from acute symptoms and can best cooperate with positioning and breath holding. 4. Left colonic diverticulosis without definite diverticulitis. 5. 3 mm nonobstructing right renal stone. 6. 3 mm right lower lobe pulmonary nodule. No follow-up needed if patient is low-risk. Non-contrast chest CT can be considered in 12 months if patient is high-risk. This recommendation follows the consensus statement: Guidelines for Management of Incidental Pulmonary Nodules Detected on CT Images: From the Fleischner Society 2017; Radiology 2017; 284:228-243. 7.  Aortic Atherosclerois (ICD10-170.0) Electronically Signed   By: Misty Stanley M.D.   On: 10/09/2017 19:03    Procedures Procedures (including critical care time)  Medications Ordered in ED Medications - No data to display   Initial Impression / Assessment and Plan / ED Course  I have reviewed the triage vital signs and the nursing notes.  Pertinent labs & imaging results that were available during my care of the patient were reviewed by me and considered in my medical decision making (see chart for details).     83 year old female with abdominal pain.  Postprandial and in the right upper quadrant.  Suspect this may be symptomatic cholelithiasis.  She is currently asymptomatic in terms of pain or nausea.  I cannot reproduce tenderness on exam.  Labs thus far are reassuring.  Will obtain a right upper quadrant ultrasound.  [place] negative.  UA noted.  Subsequent CT negative for stone or other acute pathology.  Will treat for possible UTI.  Final Clinical Impressions(s) / ED Diagnoses   Final  diagnoses:  RUQ pain  Urinary tract infection with hematuria, site unspecified    ED Discharge Orders    None       Virgel Manifold, MD 10/09/17 [phone]

## 2017-10-09 NOTE — Patient Instructions (Signed)
Please go to [facility] ER as we discussed; concern for acute cholecystitis. Cholelithiasis Cholelithiasis is also called "gallstones." It is a kind of gallbladder disease. The gallbladder is an organ that stores a liquid (bile) that helps you digest fat. Gallstones may not cause symptoms (may be silent gallstones) until they cause a blockage, and then they can cause pain (gallbladder attack). Follow these instructions at home:  Take over-the-counter and prescription medicines only as told by your doctor.  Stay at a healthy weight.  Eat healthy foods. This includes: ? Eating fewer fatty foods, like fried foods. ? Eating fewer refined carbs (refined carbohydrates). Refined carbs are breads and grains that are highly processed, like white bread and white rice. Instead, choose whole grains like whole-wheat bread and brown rice. ? Eating more fiber. Almonds, fresh fruit, and beans are healthy sources of fiber.  Keep all follow-up visits as told by your doctor. This is important. Contact a doctor if:  You have sudden pain in the upper right side of your belly (abdomen). Pain might spread to your right shoulder or your chest. This may be a sign of a gallbladder attack.  You feel sick to your stomach (are nauseous).  You throw up (vomit).  You have been diagnosed with gallstones that have no symptoms and you get: ? Belly pain. ? Discomfort, burning, or fullness in the upper part of your belly (indigestion). Get help right away if:  You have sudden pain in the upper right side of your belly, and it lasts for more than 2 hours.  You have belly pain that lasts for more than 5 hours.  You have a fever or chills.  You keep feeling sick to your stomach or you keep throwing up.  Your skin or the whites of your eyes turn yellow (jaundice).  You have dark-colored pee (urine).  You have light-colored poop (stool). Summary  Cholelithiasis is also called "gallstones."  The gallbladder is  an organ that stores a liquid (bile) that helps you digest fat.  Silent gallstones are gallstones that do not cause symptoms.  A gallbladder attack may cause sudden pain in the upper right side of your belly. Pain might spread to your right shoulder or your chest. If this happens, contact your doctor.  If you have sudden pain in the upper right side of your belly that lasts for more than 2 hours, get help right away. This information is not intended to replace advice given to you by your health care provider. Make sure you discuss any questions you have with your health care provider. Document Released: 04/01/2008 Document Revised: 06/30/2016 Document Reviewed: 06/30/2016 Elsevier Interactive Patient Education  2017 [employer].

## 2017-10-09 NOTE — Progress Notes (Signed)
Emily Spence is a 83 y.o. female with the following history as recorded in EpicCare:  Patient Active Problem List   Diagnosis Date Noted  . HLD (hyperlipidemia)   . Urge urinary incontinence   . RBBB   . Osteoporosis   . Female cystocele   . Closed compression fracture of L1 lumbar vertebra ([place])   . Hyperglycemia 09/05/2017  . Preventative health care 09/05/2017  . Abdominal mass 09/05/2017  . Anemia 09/05/2017  . Hypertension   . Hypothyroidism   . Allergic rhinitis   . Fracture of humerus, proximal, right, closed 12/09/2014  . Closed 4-part fracture of proximal end of right humerus 12/01/2014    Current Outpatient Medications  Medication Sig Dispense Refill  . amLODipine (NORVASC) 2.5 MG tablet Take 1 tablet (2.5 mg total) daily by mouth. 90 tablet 3  . aspirin 81 MG chewable tablet Chew 81 mg by mouth daily.    Marland Kitchen levothyroxine (SYNTHROID, LEVOTHROID) 50 MCG tablet Take 1 tablet (50 mcg total) See admin instructions by mouth. 1 tab daily except 1&1/2 tab on Tues and Thurs 102 tablet 3   No current facility-administered medications for this visit.     Allergies: Simvastatin and Sulfur  Past Medical History:  Diagnosis Date  . Allergic rhinitis   . Anemia   . Closed 4-part fracture of proximal end of right humerus 12/01/2014  . Closed compression fracture of L1 lumbar vertebra ([place])   . Female cystocele   . HLD (hyperlipidemia)   . Hyperglycemia 09/05/2017  . Hypertension   . Hypothyroidism   . Osteoporosis   . RBBB   . Thyroid disease   . Urge urinary incontinence     Past Surgical History:  Procedure Laterality Date  . APPENDECTOMY    . CATARACT EXTRACTION W/ INTRAOCULAR LENS  IMPLANT, BILATERAL    . REVERSE SHOULDER ARTHROPLASTY Right 12/09/2014   Procedure: RIGHT REVERSE SHOULDER ARTHROPLASTY;  Surgeon: Johnny Bridge, MD;  Location: [facility];  Service: Orthopedics;  Laterality: Right;  . TONSILLECTOMY      Family History  Problem Relation Age of Onset  .  Depression Mother     Social History   Tobacco Use  . Smoking status: Former Research scientist (life sciences)  . Smokeless tobacco: Never Used  Substance Use Topics  . Alcohol use: No    Subjective:  Patient is brought to the office by her son with complaint and concern for 48 hour history of abdominal pain and vomiting. She notes the pain is located in the upper right quadrant. She is s/p appendectomy but does still have her gallbladder. She has had pain and vomiting with everything she has tried to eat in the past 2 days. She notes that she is pain free in the office. She denies any fever. She denies any changes in her bowel habits, no blood in stool or coffee grounds emesis.   Objective:  Vitals:   10/09/17 1337  BP: 140/84  Pulse: 81  Temp: (!) 97.5 F (36.4 C)  TempSrc: Oral  SpO2: 98%  Weight: 131 lb (59.4 kg)  Height: 5\' 2"  (1.575 m)    General: Well developed, well nourished, in no acute distress; Eyes: Sclera and conjunctiva clear; pupils round and reactive to light; extraocular movements intact  Ears: External normal; canals clear; tympanic membranes normal  Oropharynx: Pink, supple. No suspicious lesions  Neck: Supple without thyromegaly, adenopathy  Lungs: Respirations unlabored; clear to auscultation bilaterally without wheeze, rales, rhonchi  Abdomen: Limited exam in office as  patient prefers to have the table elevated; Soft; ? Tenderness over RUQ;  nondistended; normoactive bowel sounds; ? Fibroid noted over lower abdomen- this was noted at her most recent CPE and she opted against any imaging Neurologic: Alert and oriented; speech intact; face symmetrical; moves all extremities well; CNII-XII intact without focal deficit   Assessment:  1. RUQ pain     Plan:  ? Acute cholecystitis; am unable to get abdominal ultrasound until tomorrow morning; patient looks stable in the office at this time but am concerned about complicating factors with her age and inability to keep food or liquids down;  discussed with son and he is in agreement; he will take her to [facility] for further evaluation; charge nurse is notified.  No Follow-up on file.  Orders Placed This Encounter  Procedures  . US Abdomen Complete    Standing Status:   Future    Standing Expiration Date:   12/10/2018    Order Specific Question:   Reason for Exam (SYMPTOM  OR DIAGNOSIS REQUIRED)    Answer:   nausea and vomiting; rule out gallstones    Order Specific Question:   Preferred imaging location?    Answer:   [id number]  . CBC    Standing Status:   Future    Standing Expiration Date:   10/09/2018  . Comprehensive metabolic panel    Standing Status:   Future    Standing Expiration Date:   10/09/2018    Requested Prescriptions    No prescriptions requested or ordered in this encounter

## 2017-10-10 LAB — URINE CULTURE

## 2018-03-06 ENCOUNTER — Ambulatory Visit: Payer: Medicare Other | Admitting: Internal Medicine

## 2018-03-06 DIAGNOSIS — Z0289 Encounter for other administrative examinations: Secondary | ICD-10-CM

## 2018-05-18 ENCOUNTER — Encounter: Payer: Self-pay | Admitting: Sports Medicine

## 2018-05-18 ENCOUNTER — Ambulatory Visit (INDEPENDENT_AMBULATORY_CARE_PROVIDER_SITE_OTHER): Payer: Medicare Other | Admitting: Sports Medicine

## 2018-05-18 DIAGNOSIS — B351 Tinea unguium: Secondary | ICD-10-CM

## 2018-05-18 DIAGNOSIS — M79675 Pain in left toe(s): Secondary | ICD-10-CM

## 2018-05-18 DIAGNOSIS — I739 Peripheral vascular disease, unspecified: Secondary | ICD-10-CM

## 2018-05-18 DIAGNOSIS — M79674 Pain in right toe(s): Secondary | ICD-10-CM

## 2018-05-18 NOTE — Progress Notes (Signed)
Subjective: Emily Spence is a 84 y.o. female patient seen today in office with complaint of mildly painful thickened and elongated toenails; unable to trim. Patient denies history of Diabetes, Neuropathy, or Vascular disease. Patient has no other pedal complaints at this time.   Patient is assisted by son who helps to report her history.  Review of Systems  All other systems reviewed and are negative.    Patient Active Problem List   Diagnosis Date Noted  . HLD (hyperlipidemia)   . Urge urinary incontinence   . RBBB   . Osteoporosis   . Female cystocele   . Closed compression fracture of L1 lumbar vertebra   . Hyperglycemia 09/05/2017  . Preventative health care 09/05/2017  . Abdominal mass 09/05/2017  . Anemia 09/05/2017  . Hypertension   . Hypothyroidism   . Allergic rhinitis   . Fracture of humerus, proximal, right, closed 12/09/2014  . Closed 4-part fracture of proximal end of right humerus 12/01/2014    Current Outpatient Medications on File Prior to Visit  Medication Sig Dispense Refill  . amLODipine (NORVASC) 2.5 MG tablet Take 1 tablet (2.5 mg total) daily by mouth. 90 tablet 3  . cholecalciferol (VITAMIN D) 1000 units tablet Take 1,000 Units by mouth daily.    Marland Kitchen levothyroxine (SYNTHROID, LEVOTHROID) 50 MCG tablet Take 1 tablet (50 mcg total) See admin instructions by mouth. 1 tab daily except 1&1/2 tab on Tues and Thurs 102 tablet 3   No current facility-administered medications on file prior to visit.     Allergies  Allergen Reactions  . Simvastatin     unknown  . Sulfur     unknown    Objective: Physical Exam  General: Well developed, nourished, no acute distress, awake, alert and oriented x 2  Vascular: Dorsalis pedis artery 1/4 bilateral, Posterior tibial artery 1/4 bilateral, skin temperature warm to warm proximal to distal bilateral lower extremities, ++ varicosities, no pedal hair present bilateral.  Neurological: Gross sensation present via  light touch bilateral.   Dermatological: Skin is warm, dry, and supple bilateral, Nails 1-10 are tender, long, thick, and discolored with mild subungal debris, no webspace macerations present bilateral, no open lesions present bilateral, no callus/corns/hyperkeratotic tissue present bilateral. No signs of infection bilateral.  Musculoskeletal: Asymptomatic bunion and hammertoe boney deformities noted bilateral. Muscular strength within normal limits without painon range of motion. No pain with calf compression bilateral.  Assessment and Plan:  Problem List Items Addressed This Visit    None    Visit Diagnoses    Pain due to onychomycosis of toenails of both feet    -  Primary   PVD (peripheral vascular disease) ([place])         -Examined patient.  -Discussed treatment options for painful mycotic nails. -Mechanically debrided and reduced mycotic nails with sterile nail nipper and dremel nail file without incident. -Patient to return in 3 months for follow up evaluation or sooner if symptoms worsen.  Landis Martins, DPM

## 2018-06-02 ENCOUNTER — Telehealth: Payer: Self-pay | Admitting: Internal Medicine

## 2018-06-02 NOTE — Telephone Encounter (Signed)
Copied from [place] [id number]. Topic: Quick Communication - See Telephone Encounter >> Jun 02, 2018 12:13 PM Bea Graff, NT wrote: CRM for notification. See Telephone encounter for: 06/02/18. Pts son, Shanon Brow, calling to see if there is something that his mom can take OTC for memory problems that she has developed. Please advise.

## 2018-06-02 NOTE — Telephone Encounter (Signed)
Sorry, not that would be effective

## 2018-06-03 ENCOUNTER — Telehealth: Payer: Self-pay | Admitting: Emergency Medicine

## 2018-06-03 NOTE — Telephone Encounter (Signed)
Called patient to schdule AWV. Patient declined at this time.

## 2018-06-03 NOTE — Telephone Encounter (Signed)
Pt's son has been informed and has scheduled an appt to come in to discuss.

## 2018-06-05 ENCOUNTER — Encounter: Payer: Self-pay | Admitting: Internal Medicine

## 2018-06-05 ENCOUNTER — Ambulatory Visit (INDEPENDENT_AMBULATORY_CARE_PROVIDER_SITE_OTHER): Payer: Medicare Other | Admitting: Internal Medicine

## 2018-06-05 ENCOUNTER — Other Ambulatory Visit (INDEPENDENT_AMBULATORY_CARE_PROVIDER_SITE_OTHER): Payer: Medicare Other

## 2018-06-05 VITALS — BP 180/90 | HR 71 | Temp 98.0°F | Ht 62.0 in | Wt 133.0 lb

## 2018-06-05 DIAGNOSIS — I10 Essential (primary) hypertension: Secondary | ICD-10-CM

## 2018-06-05 DIAGNOSIS — R413 Other amnesia: Secondary | ICD-10-CM

## 2018-06-05 DIAGNOSIS — E039 Hypothyroidism, unspecified: Secondary | ICD-10-CM | POA: Diagnosis not present

## 2018-06-05 DIAGNOSIS — R739 Hyperglycemia, unspecified: Secondary | ICD-10-CM

## 2018-06-05 LAB — CBC WITH DIFFERENTIAL/PLATELET
BASOS ABS: 0.1 10*3/uL (ref 0.0–0.1)
Basophils Relative: 1.1 % (ref 0.0–3.0)
EOS ABS: 0.2 10*3/uL (ref 0.0–0.7)
Eosinophils Relative: 2.6 % (ref 0.0–5.0)
HCT: 40.1 % (ref 36.0–46.0)
HEMOGLOBIN: 13.3 g/dL (ref 12.0–15.0)
LYMPHS PCT: 32.2 % (ref 12.0–46.0)
Lymphs Abs: 1.9 10*3/uL (ref 0.7–4.0)
MCHC: 33.2 g/dL (ref 30.0–36.0)
MCV: 93.4 fl (ref 78.0–100.0)
MONO ABS: 0.5 10*3/uL (ref 0.1–1.0)
Monocytes Relative: 8.9 % (ref 3.0–12.0)
Neutro Abs: 3.3 10*3/uL (ref 1.4–7.7)
Neutrophils Relative %: 55.2 % (ref 43.0–77.0)
Platelets: 149 10*3/uL — ABNORMAL LOW (ref 150.0–400.0)
RBC: 4.3 Mil/uL (ref 3.87–5.11)
RDW: 14.2 % (ref 11.5–15.5)
WBC: 5.9 10*3/uL (ref 4.0–10.5)

## 2018-06-05 LAB — HEPATIC FUNCTION PANEL
ALBUMIN: 4.2 g/dL (ref 3.5–5.2)
ALT: 11 U/L (ref 0–35)
AST: 16 U/L (ref 0–37)
Alkaline Phosphatase: 57 U/L (ref 39–117)
BILIRUBIN TOTAL: 0.7 mg/dL (ref 0.2–1.2)
Bilirubin, Direct: 0.1 mg/dL (ref 0.0–0.3)
Total Protein: 7.2 g/dL (ref 6.0–8.3)

## 2018-06-05 LAB — BASIC METABOLIC PANEL
BUN: 13 mg/dL (ref 6–23)
CHLORIDE: 106 meq/L (ref 96–112)
CO2: 29 mEq/L (ref 19–32)
CREATININE: 0.79 mg/dL (ref 0.40–1.20)
Calcium: 9.6 mg/dL (ref 8.4–10.5)
GFR: 72.08 mL/min (ref >60.00)
GLUCOSE: 110 mg/dL — AB (ref 70–99)
POTASSIUM: 3.9 meq/L (ref 3.5–5.1)
Sodium: 142 mEq/L (ref 135–145)

## 2018-06-05 NOTE — Assessment & Plan Note (Signed)
Son states will start checking BP at home several times per wk and call or return for persistent > 140/90

## 2018-06-05 NOTE — Progress Notes (Signed)
Subjective:    Patient ID: Emily Spence, female    DOB: 1921-12-14, 84 y.o.   MRN: [id number]  HPI  Here with son who c/o her mild worsening ST memory over the last 4-6 mo; living at assisted living and has 4 hours of private sitter daily given her long term care insurance, but appears to have worsening ST memory and decreased comprehension, though also has severe HOH as well.  No longer able to manage her medications except to take pills as presented in her pill box filled by the son.  Pt denies chest pain, increasing sob or doe, wheezing, orthopnea, PND, increased LE swelling, palpitations, dizziness or syncope.  Pt denies new neurological symptoms such as new headache, or facial or extremity weakness or numbness.  Pt denies polydipsia, polyuria  Son states BP at home is < 140/90 but have not checked recently. Past Medical History:  Diagnosis Date  . Allergic rhinitis   . Anemia   . Closed 4-part fracture of proximal end of right humerus 12/01/2014  . Closed compression fracture of L1 lumbar vertebra   . Female cystocele   . HLD (hyperlipidemia)   . Hyperglycemia 09/05/2017  . Hypertension   . Hypothyroidism   . Osteoporosis   . RBBB   . Thyroid disease   . Urge urinary incontinence    Past Surgical History:  Procedure Laterality Date  . APPENDECTOMY    . CATARACT EXTRACTION W/ INTRAOCULAR LENS  IMPLANT, BILATERAL    . REVERSE SHOULDER ARTHROPLASTY Right 12/09/2014   Procedure: RIGHT REVERSE SHOULDER ARTHROPLASTY;  Surgeon: Johnny Bridge, MD;  Location: [facility];  Service: Orthopedics;  Laterality: Right;  . TONSILLECTOMY      reports that she has quit smoking. She has never used smokeless tobacco. She reports that she does not drink alcohol or use drugs. family history includes Depression in her mother. Allergies  Allergen Reactions  . Simvastatin     unknown  . Sulfur     unknown   Current Outpatient Medications on File Prior to Visit  Medication Sig Dispense Refill  .  amLODipine (NORVASC) 2.5 MG tablet Take 1 tablet (2.5 mg total) daily by mouth. 90 tablet 3  . cholecalciferol (VITAMIN D) 1000 units tablet Take 1,000 Units by mouth daily.    Marland Kitchen levothyroxine (SYNTHROID, LEVOTHROID) 50 MCG tablet Take 1 tablet (50 mcg total) See admin instructions by mouth. 1 tab daily except 1&1/2 tab on Tues and Thurs 102 tablet 3   No current facility-administered medications on file prior to visit.    Review of Systems  Constitutional: Negative for other unusual diaphoresis or sweats HENT: Negative for ear discharge or swelling Eyes: Negative for other worsening visual disturbances Respiratory: Negative for stridor or other swelling  Gastrointestinal: Negative for worsening distension or other blood Genitourinary: Negative for retention or other urinary change Musculoskeletal: Negative for other MSK pain or swelling Skin: Negative for color change or other new lesions Neurological: Negative for worsening tremors and other numbness  Psychiatric/Behavioral: Negative for worsening agitation or other fatigue All other system neg per pt    Objective:   Physical Exam BP (!) 180/90 (BP Location: Left Arm, Patient Position: Sitting, Cuff Size: Normal)   Pulse 71   Temp 98 F (36.7 C) (Oral)   Ht 5\' 2"  (1.575 m)   Wt 133 lb (60.3 kg)   SpO2 95%   BMI 24.33 kg/m  VS noted,  Constitutional: Pt appears in NAD HENT: Head: NCAT.  Right Ear: External ear normal.  Left Ear: External ear normal.  Eyes: . Pupils are equal, round, and reactive to light. Conjunctivae and EOM are normal Nose: without d/c or deformity Neck: Neck supple. Gross normal ROM Cardiovascular: Normal rate and regular rhythm.   Pulmonary/Chest: Effort normal and breath sounds without rales or wheezing.  Abd:  Soft, NT, ND, + BS, no organomegaly Neurological: Pt is alert. At baseline orientation to person and place but has some ST cognitive decrease, motor grossly intact Skin: Skin is warm. No rashes,  other new lesions, no LE edema Psychiatric: Pt behavior is normal without agitation   Lab Results  Component Value Date   WBC 8.3 10/09/2017   HGB 13.5 10/09/2017   HCT 40.8 10/09/2017   PLT 148 (L) 10/09/2017   GLUCOSE 129 (H) 10/09/2017   CHOL 227 (H) 09/05/2017   TRIG 193.0 (H) 09/05/2017   HDL 60.10 09/05/2017   LDLCALC 128 (H) 09/05/2017   ALT 15 10/09/2017   AST 23 10/09/2017   NA 136 10/09/2017   K 3.3 (L) 10/09/2017   CL 105 10/09/2017   CREATININE 0.81 10/09/2017   BUN 9 10/09/2017   CO2 23 10/09/2017   TSH 1.28 09/05/2017   INR 1.2 02/11/2008   HGBA1C 6.0 09/05/2017       Assessment & Plan:

## 2018-06-05 NOTE — Assessment & Plan Note (Signed)
Likely mild cognitive impairment, d/w son to consider aricept 5 but pt declines and he assents at this time, to check B12, declines Head MRI

## 2018-06-05 NOTE — Patient Instructions (Addendum)
Please continue all other medications as before, and refills have been done if requested.  Please have the pharmacy call with any other refills you may need.  Please continue your efforts at being more active, low cholesterol diet  Please keep your appointments with your specialists as you may have planned  Please call if you change your mind about starting Aricept 5 mg per day  Please go to the LAB in the Basement (turn left off the elevator) for the tests to be done today  You will be contacted by phone if any changes need to be made immediately.  Otherwise, you will receive a letter about your results with an explanation, but please check with MyChart first.  Please remember to sign up for MyChart if you have not done so, as this will be important to you in the future with finding out test results, communicating by private email, and scheduling acute appointments online when needed.  Please return in 6 months, or sooner if needed

## 2018-06-05 NOTE — Assessment & Plan Note (Signed)
Lab Results  Component Value Date   HGBA1C 6.0 09/05/2017  stable overall by history and exam, recent data reviewed with pt, and pt to continue medical treatment as before,  to f/u any worsening symptoms or concerns

## 2018-06-05 NOTE — Assessment & Plan Note (Signed)
Lab Results  Component Value Date   TSH 1.28 09/05/2017  stable overall by history and exam, recent data reviewed with pt, and pt to continue medical treatment as before,  to f/u any worsening symptoms or concerns

## 2018-06-08 ENCOUNTER — Encounter: Payer: Self-pay | Admitting: Internal Medicine

## 2018-06-08 LAB — VITAMIN B12: Vitamin B-12: 376 pg/mL (ref 211–911)

## 2018-06-08 LAB — TSH: TSH: 2.48 u[IU]/mL (ref 0.35–4.50)

## 2018-07-01 ENCOUNTER — Telehealth: Payer: Self-pay | Admitting: Internal Medicine

## 2018-07-01 MED ORDER — DONEPEZIL HCL 5 MG PO TABS: 5.0000 mg | ORAL_TABLET | Freq: Every day | ORAL | 3 refills | Status: DC

## 2018-07-01 NOTE — Telephone Encounter (Signed)
Requesting script be sent to [facility], requesting call back once sent [phone]

## 2018-07-01 NOTE — Telephone Encounter (Signed)
Done erx 

## 2018-07-01 NOTE — Addendum Note (Signed)
Addended by: Biagio Borg on: 07/01/2018 12:41 PM   Modules accepted: Orders

## 2018-07-01 NOTE — Addendum Note (Signed)
Addended by: Juliet Rude on: 07/01/2018 02:52 PM   Modules accepted: Orders

## 2018-07-01 NOTE — Telephone Encounter (Signed)
Copied from [place] [phone]. Topic: Quick Communication - See Telephone Encounter >> Jul 01, 2018  8:12 AM Synthia Innocent wrote: CRM for notification. See Telephone encounter for: 07/01/18. Son calling and would like to move forward with memory medications. [facility], 90 day supply

## 2018-07-06 ENCOUNTER — Telehealth: Payer: Self-pay | Admitting: Emergency Medicine

## 2018-07-06 NOTE — Telephone Encounter (Signed)
Copied from [place] [id number]. Topic: General - Other >> Jul 06, 2018  2:12 PM Marin Olp L wrote: Reason for CRM: Patient would like a call from [place] to discuss donepezil (ARICEPT) 5 MG tablet hich she is not sure why it is she is taking. >> Jul 06, 2018  3:17 PM Selinda Flavin B, NT wrote: Patient calling back to check the status. Please advise.

## 2018-07-06 NOTE — Telephone Encounter (Signed)
Spoke with pt, states she does not like thing things/ side effect that are associated with the Aricept. Pt does not feel that she has memory issues and does not care to take the medication at this time.

## 2018-07-28 ENCOUNTER — Telehealth: Payer: Self-pay | Admitting: Internal Medicine

## 2018-07-28 ENCOUNTER — Telehealth: Payer: Self-pay

## 2018-07-28 MED ORDER — LEVOTHYROXINE SODIUM 50 MCG PO TABS: 50.0000 ug | ORAL_TABLET | ORAL | 1 refills | Status: DC

## 2018-07-28 NOTE — Telephone Encounter (Signed)
Copied from [place] [phone]. Topic: General - Other >> Jul 28, 2018 10:41 AM Judyann Munson wrote: Reason for CRM: patient son is calling to increase  her nurse in home care  to 8-10 hrs a due to memory loss. [facility] [phone].

## 2018-07-28 NOTE — Telephone Encounter (Signed)
Very sorry, I dont have any influence on this as the [facility] have to follow certain rules  Please direct pt to contact the [facility] for this type of reqeust

## 2018-07-28 NOTE — Telephone Encounter (Signed)
Pt's son, [name], has been informed and will try to contact [facility] again.

## 2018-07-28 NOTE — Telephone Encounter (Signed)
Copied from [place] [id number]. Topic: Quick Communication - Rx Refill/Question >> Jul 28, 2018 10:40 AM Judyann Munson wrote: Medication: levothyroxine (SYNTHROID, LEVOTHROID) 50 MCG tablet   Has the patient contacted their pharmacy? No   Preferred Pharmacy (with phone number or street name): [facility], [place] [phone] (Phone) [phone] (Fax)    Agent: Please be advised that RX refills may take up to 3 business days. We ask that you follow-up with your pharmacy.

## 2018-08-16 ENCOUNTER — Other Ambulatory Visit: Payer: Self-pay | Admitting: Internal Medicine

## 2018-08-25 DIAGNOSIS — Z23 Encounter for immunization: Secondary | ICD-10-CM | POA: Diagnosis not present

## 2019-05-19 ENCOUNTER — Telehealth: Payer: Self-pay

## 2019-05-19 NOTE — Telephone Encounter (Signed)
Pt likely has "mild cognitive impairment" but I am not clear about competancy  I would not be able to provide this letter, though psychiatry or neurology would likely be needed

## 2019-05-19 NOTE — Telephone Encounter (Signed)
Pt's son, [name], as been informed.

## 2019-05-19 NOTE — Telephone Encounter (Signed)
Copied from [place] [id number]. Topic: General - Inquiry >> May 19, 2019 11:09 AM Alanda Slim E wrote: Reason for CRM: Emily Spence called and stated that the Pt attorney needed a competency letter from the provider for the Pt. Pt is currently on lock down at [facility]. Emily Spence would like to speak with someone to know how this matter can be handled or how he can get the office the info needed and get it back to the attorney/ please advise

## 2019-05-30 ENCOUNTER — Other Ambulatory Visit: Payer: Self-pay | Admitting: Internal Medicine

## 2019-07-17 ENCOUNTER — Other Ambulatory Visit: Payer: Self-pay | Admitting: Internal Medicine

## 2019-09-05 ENCOUNTER — Other Ambulatory Visit: Payer: Self-pay | Admitting: Internal Medicine

## 2019-10-28 ENCOUNTER — Ambulatory Visit (INDEPENDENT_AMBULATORY_CARE_PROVIDER_SITE_OTHER): Payer: Medicare Other | Admitting: Internal Medicine

## 2019-10-28 ENCOUNTER — Encounter: Payer: Self-pay | Admitting: Internal Medicine

## 2019-10-28 ENCOUNTER — Other Ambulatory Visit: Payer: Self-pay

## 2019-10-28 VITALS — BP 128/82 | HR 72 | Temp 98.6°F | Resp 12 | Ht 61.0 in | Wt 131.0 lb

## 2019-10-28 DIAGNOSIS — E559 Vitamin D deficiency, unspecified: Secondary | ICD-10-CM | POA: Diagnosis not present

## 2019-10-28 DIAGNOSIS — E785 Hyperlipidemia, unspecified: Secondary | ICD-10-CM

## 2019-10-28 DIAGNOSIS — R300 Dysuria: Secondary | ICD-10-CM

## 2019-10-28 DIAGNOSIS — R413 Other amnesia: Secondary | ICD-10-CM

## 2019-10-28 DIAGNOSIS — E611 Iron deficiency: Secondary | ICD-10-CM | POA: Diagnosis not present

## 2019-10-28 DIAGNOSIS — E538 Deficiency of other specified B group vitamins: Secondary | ICD-10-CM | POA: Diagnosis not present

## 2019-10-28 DIAGNOSIS — E039 Hypothyroidism, unspecified: Secondary | ICD-10-CM

## 2019-10-28 DIAGNOSIS — R739 Hyperglycemia, unspecified: Secondary | ICD-10-CM | POA: Diagnosis not present

## 2019-10-28 DIAGNOSIS — Z23 Encounter for immunization: Secondary | ICD-10-CM | POA: Diagnosis not present

## 2019-10-28 DIAGNOSIS — I10 Essential (primary) hypertension: Secondary | ICD-10-CM

## 2019-10-28 LAB — BASIC METABOLIC PANEL
BUN: 13 mg/dL (ref 6–23)
CO2: 24 mEq/L (ref 19–32)
Calcium: 9.1 mg/dL (ref 8.4–10.5)
Chloride: 103 mEq/L (ref 96–112)
Creatinine, Ser: 0.74 mg/dL (ref 0.40–1.20)
GFR: 72.91 mL/min (ref >60.00)
Glucose, Bld: 97 mg/dL (ref 70–99)
Potassium: 4.1 mEq/L (ref 3.5–5.1)
Sodium: 139 mEq/L (ref 135–145)

## 2019-10-28 LAB — CBC WITH DIFFERENTIAL/PLATELET
Basophils Absolute: 0.1 10*3/uL (ref 0.0–0.1)
Basophils Relative: 0.8 % (ref 0.0–3.0)
Eosinophils Absolute: 0.1 10*3/uL (ref 0.0–0.7)
Eosinophils Relative: 2 % (ref 0.0–5.0)
HCT: 40.5 % (ref 36.0–46.0)
Hemoglobin: 13 g/dL (ref 12.0–15.0)
Lymphocytes Relative: 26.4 % (ref 12.0–46.0)
Lymphs Abs: 1.7 10*3/uL (ref 0.7–4.0)
MCHC: 32.2 g/dL (ref 30.0–36.0)
MCV: 95.1 fl (ref 78.0–100.0)
Monocytes Absolute: 0.5 10*3/uL (ref 0.1–1.0)
Monocytes Relative: 7.6 % (ref 3.0–12.0)
Neutro Abs: 3.9 10*3/uL (ref 1.4–7.7)
Neutrophils Relative %: 63.2 % (ref 43.0–77.0)
Platelets: 130 10*3/uL — ABNORMAL LOW (ref 150.0–400.0)
RBC: 4.26 Mil/uL (ref 3.87–5.11)
RDW: 14.1 % (ref 11.5–15.5)
WBC: 6.2 10*3/uL (ref 4.0–10.5)

## 2019-10-28 LAB — IBC PANEL
Iron: 119 ug/dL (ref 42–145)
Saturation Ratios: 37 % (ref 20.0–50.0)
Transferrin: 230 mg/dL (ref 212.0–360.0)

## 2019-10-28 LAB — VITAMIN D 25 HYDROXY (VIT D DEFICIENCY, FRACTURES): VITD: 31.13 ng/mL (ref 30.00–100.00)

## 2019-10-28 LAB — LIPID PANEL
Cholesterol: 221 mg/dL — ABNORMAL HIGH (ref 0–200)
HDL: 64.4 mg/dL (ref >39.00)
LDL Cholesterol: 123 mg/dL — ABNORMAL HIGH (ref 0–99)
NonHDL: 156.82
Total CHOL/HDL Ratio: 3
Triglycerides: 170 mg/dL — ABNORMAL HIGH (ref 0.0–149.0)
VLDL: 34 mg/dL (ref 0.0–40.0)

## 2019-10-28 LAB — HEPATIC FUNCTION PANEL
ALT: 12 U/L (ref 0–35)
AST: 21 U/L (ref 0–37)
Albumin: 4 g/dL (ref 3.5–5.2)
Alkaline Phosphatase: 44 U/L (ref 39–117)
Bilirubin, Direct: 0.1 mg/dL (ref 0.0–0.3)
Total Bilirubin: 0.8 mg/dL (ref 0.2–1.2)
Total Protein: 6.3 g/dL (ref 6.0–8.3)

## 2019-10-28 LAB — TSH: TSH: 4 u[IU]/mL (ref 0.35–4.50)

## 2019-10-28 LAB — HEMOGLOBIN A1C: Hgb A1c MFr Bld: 5.9 % (ref 4.6–6.5)

## 2019-10-28 LAB — VITAMIN B12: Vitamin B-12: 389 pg/mL (ref 211–911)

## 2019-10-28 NOTE — Patient Instructions (Addendum)
You had the Prevnar pneumonia shot today  Please continue all other medications as before, and refills have been done if requested.  Please have the pharmacy call with any other refills you may need.  Please continue your efforts at being more active, low cholesterol diet, and weight control.  You are otherwise up to date with prevention measures today.  Please keep your appointments with your specialists as you may have planned  Please go to the LAB at the blood drawing area for the tests to be done  You will be contacted by phone if any changes need to be made immediately.  Otherwise, you will receive a letter about your results with an explanation, but please check with MyChart first.  Please remember to sign up for MyChart if you have not done so, as this will be important to you in the future with finding out test results, communicating by private email, and scheduling acute appointments online when needed.  Please return in 1 year for your yearly visit, or sooner if needed

## 2019-10-28 NOTE — Progress Notes (Signed)
Subjective:    Patient ID: Emily Spence, female    DOB: [birth year], 85 y.o.   MRN: [id number]  HPI  Here for yearly f/u with son for support  Overall doing ok;  Pt denies Chest pain, worsening SOB, DOE, wheezing, orthopnea, PND, worsening LE edema, palpitations, dizziness or syncope.  Pt denies neurological change such as new headache, facial or extremity weakness, but has mild worsening ST memory loss..  Pt denies polydipsia, polyuria, or low sugar symptoms. Pt states overall good compliance with treatment and medications, good tolerability, and has been trying to follow appropriate diet.  Pt denies worsening depressive symptoms, suicidal ideation or panic. No fever, night sweats, wt loss, loss of appetite, or other constitutional symptoms.  Pt states good ability with ADL's, has low fall risk, home safety reviewed and adequate, no other significant changes in hearing or vision.   Also with mild dysuria in the past few days but Denies urinary symptoms such as frequency, urgency, flank pain, hematuria or n/v, fever, chills.   Past Medical History:  Diagnosis Date  . Allergic rhinitis   . Anemia   . Closed 4-part fracture of proximal end of right humerus 12/01/2014  . Closed compression fracture of L1 lumbar vertebra   . Female cystocele   . HLD (hyperlipidemia)   . Hyperglycemia 09/05/2017  . Hypertension   . Hypothyroidism   . Osteoporosis   . RBBB   . Thyroid disease   . Urge urinary incontinence    Past Surgical History:  Procedure Laterality Date  . APPENDECTOMY    . CATARACT EXTRACTION W/ INTRAOCULAR LENS  IMPLANT, BILATERAL    . REVERSE SHOULDER ARTHROPLASTY Right 12/09/2014   Procedure: RIGHT REVERSE SHOULDER ARTHROPLASTY;  Surgeon: Johnny Bridge, MD;  Location: [facility];  Service: Orthopedics;  Laterality: Right;  . TONSILLECTOMY      reports that she has quit smoking. She has never used smokeless tobacco. She reports that she does not drink alcohol or use drugs. family  history includes Depression in her mother. Allergies  Allergen Reactions  . Simvastatin     unknown  . Sulfur     unknown   Current Outpatient Medications on File Prior to Visit  Medication Sig Dispense Refill  . amLODipine (NORVASC) 2.5 MG tablet TAKE 1 TABLET BY MOUTH  DAILY 90 tablet 1  . cholecalciferol (VITAMIN D) 1000 units tablet Take 1,000 Units by mouth daily.    Marland Kitchen levothyroxine (SYNTHROID) 50 MCG tablet TAKE 1 TABLET BY MOUTH  DAILY EXCEPT 1 AND 1/2  TABLETS ON TUESDAY AND  THURSDAY 100 tablet 0   No current facility-administered medications on file prior to visit.   Review of Systems  Constitutional: Negative for other unusual diaphoresis or sweats HENT: Negative for ear discharge or swelling Eyes: Negative for other worsening visual disturbances Respiratory: Negative for stridor or other swelling  Gastrointestinal: Negative for worsening distension or other blood Genitourinary: Negative for retention or other urinary change Musculoskeletal: Negative for other MSK pain or swelling Skin: Negative for color change or other new lesions Neurological: Negative for worsening tremors and other numbness  Psychiatric/Behavioral: Negative for worsening agitation or other fatigue All otherwise neg per pt     Objective:   Physical Exam BP 128/82   Pulse 72   Temp 98.6 F (37 C)   Resp 12   Ht 5\' 1"  (1.549 m)   Wt 131 lb (59.4 kg)   SpO2 95%   BMI 24.75 kg/m  VS  noted,  Constitutional: Pt appears in NAD HENT: Head: NCAT.  Right Ear: External ear normal.  Left Ear: External ear normal.  Eyes: . Pupils are equal, round, and reactive to light. Conjunctivae and EOM are normal Nose: without d/c or deformity Neck: Neck supple. Gross normal ROM Cardiovascular: Normal rate and regular rhythm.   Pulmonary/Chest: Effort normal and breath sounds without rales or wheezing.  Abd:  Soft, NT, ND, + BS, no organomegaly Neurological: Pt is alert. At baseline orientation, motor  grossly intact Skin: Skin is warm. No rashes, other new lesions, no LE edema Psychiatric: Pt behavior is normal without agitation  All otherwise neg per pt Lab Results  Component Value Date   WBC 6.2 10/28/2019   HGB 13.0 10/28/2019   HCT 40.5 10/28/2019   PLT 130.0 (L) 10/28/2019   GLUCOSE 97 10/28/2019   CHOL 221 (H) 10/28/2019   TRIG 170.0 (H) 10/28/2019   HDL 64.40 10/28/2019   LDLCALC 123 (H) 10/28/2019   ALT 12 10/28/2019   AST 21 10/28/2019   NA 139 10/28/2019   K 4.1 10/28/2019   CL 103 10/28/2019   CREATININE 0.74 10/28/2019   BUN 13 10/28/2019   CO2 24 10/28/2019   TSH 4.00 10/28/2019   INR 1.2 02/11/2008   HGBA1C 5.9 10/28/2019          Assessment & Plan:

## 2019-10-29 ENCOUNTER — Encounter: Payer: Self-pay | Admitting: Internal Medicine

## 2019-10-29 NOTE — Assessment & Plan Note (Addendum)
?   uti - for urine studies,  to f/u any worsening symptoms or concerns  Note:  Total time for pt hx, exam, review of record with pt in the room, determination of diagnoses and plan for further eval and tx is > 40 min, with over 50% spent in coordination and counseling of patient including the differential dx, tx, further evaluation and other management of dysuria, HTN, hypothyroidism, memory los, hyperglycemia, HLD

## 2019-10-29 NOTE — Assessment & Plan Note (Signed)
Mild worsening, has been aricept intolerant in past per son, declines further med tx for now

## 2019-10-29 NOTE — Assessment & Plan Note (Signed)
stable overall by history and exam, recent data reviewed with pt, and pt to continue medical treatment as before,  to f/u any worsening symptoms or concerns  

## 2019-10-29 NOTE — Assessment & Plan Note (Signed)
stable overall by history and exam, recent data reviewed with pt, and pt to continue medical treatment as before,  to f/u any worsening symptoms or concerns, for lab f/u 

## 2019-10-31 ENCOUNTER — Encounter: Payer: Self-pay | Admitting: Internal Medicine

## 2019-11-01 ENCOUNTER — Telehealth: Payer: Self-pay | Admitting: *Deleted

## 2019-11-01 NOTE — Telephone Encounter (Signed)
"  medical supply" could mean so many things  Is there a specific medical supply to which we can fax the order?

## 2019-11-01 NOTE — Telephone Encounter (Signed)
Pt son requesting Rx for commode send to medical supply. Please advise

## 2019-11-02 IMAGING — CT CT RENAL STONE PROTOCOL
2 of 4 series · 15 of 46 positions shown, 17 images · non-contrast
Comparison: 02/12/2008.

CLINICAL DATA: Right upper quadrant pain with nausea vomiting after
eating.

EXAM:
CT ABDOMEN AND PELVIS WITHOUT CONTRAST
TECHNIQUE: Multidetector CT imaging of the abdomen and pelvis was performed
following the standard protocol without IV contrast.

[Series 2: axial st · axial · 0.71mm/px · z∈[-247,+93]mm · 12 of 78 slices shown, 14 images]
[im 5/78  soft-tissue]
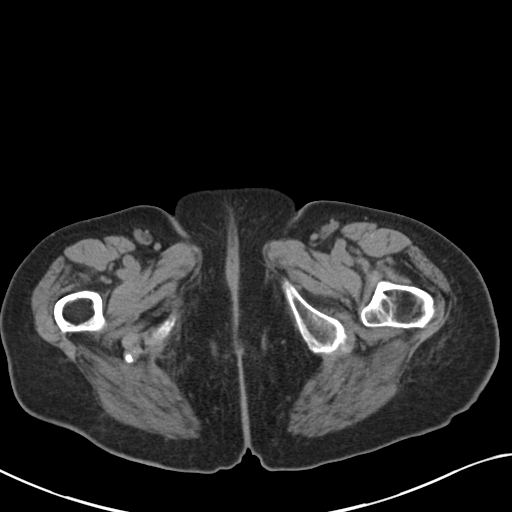
[im 5/78  bone]
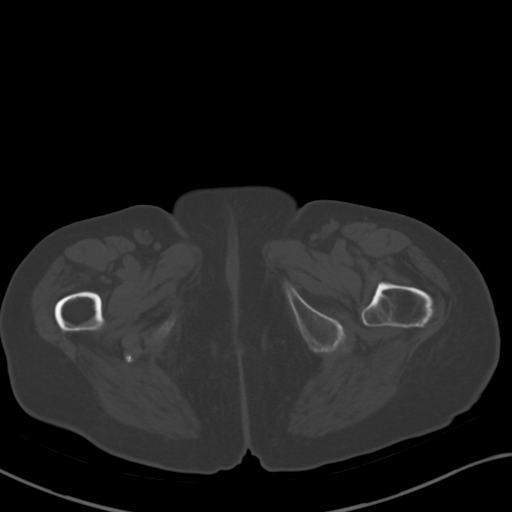
[im 13/78  soft-tissue]
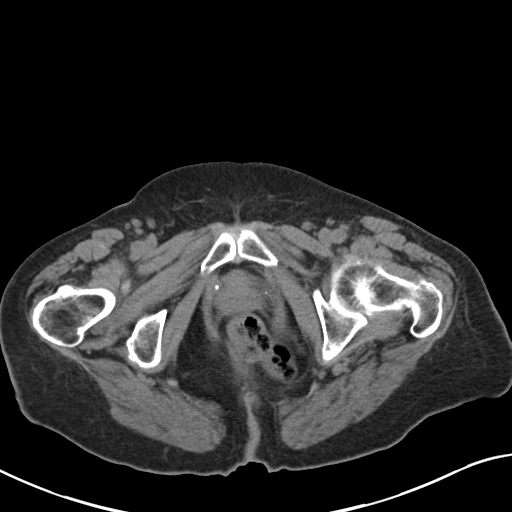
[im 17/78  soft-tissue]
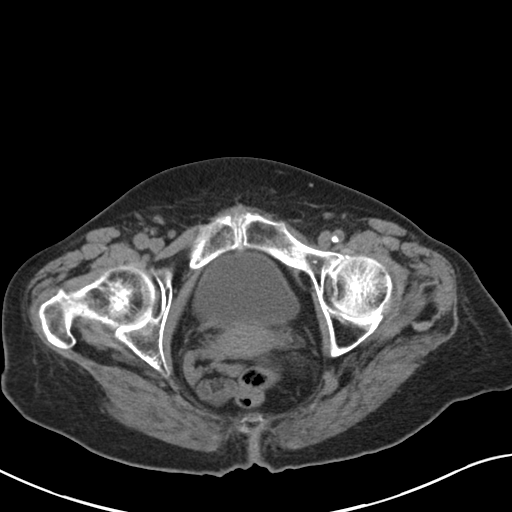
[im 25/78  soft-tissue]
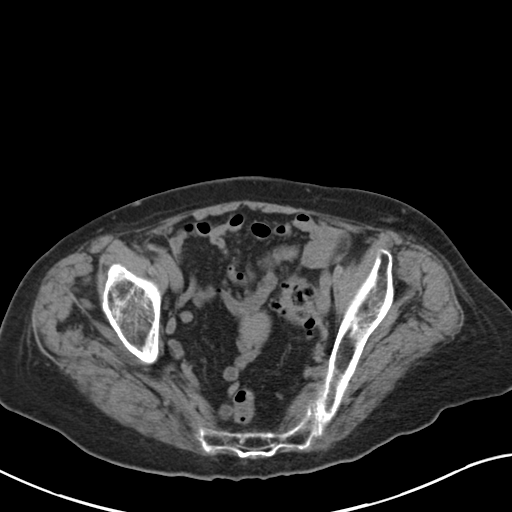
[im 29/78  soft-tissue]
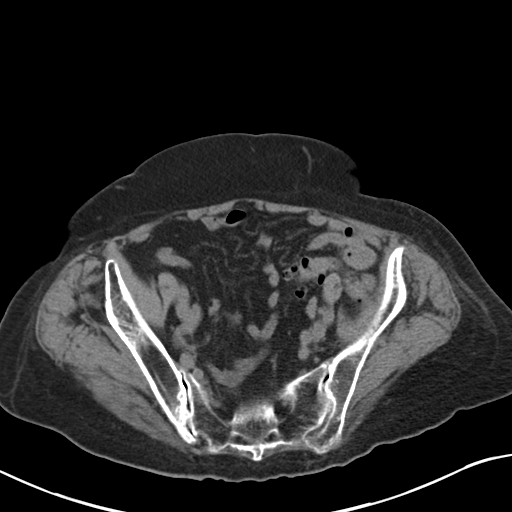
[im 37/78  soft-tissue]
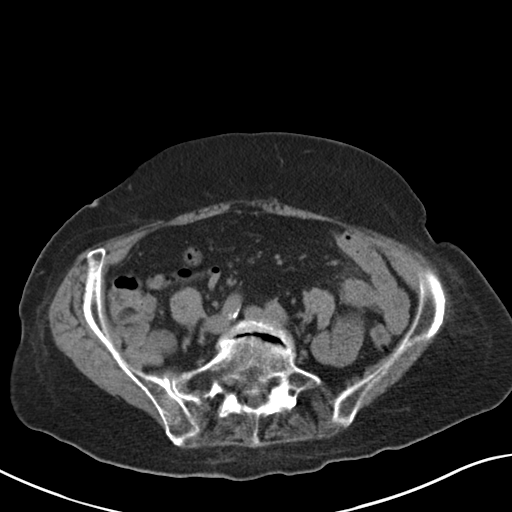
[im 41/78  soft-tissue]
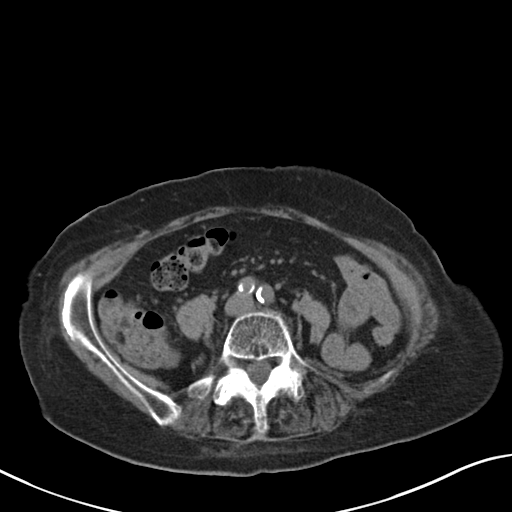
[im 49/78  soft-tissue]
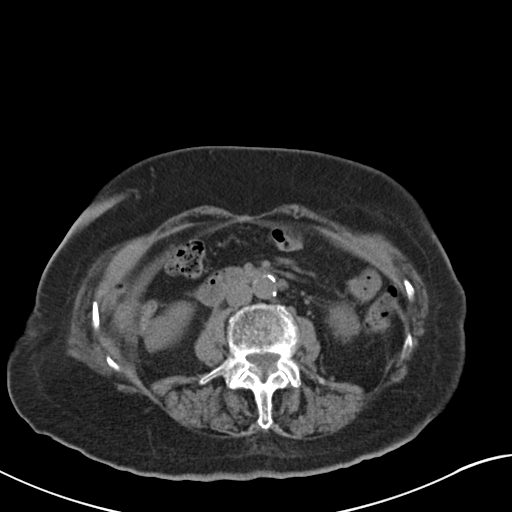
[im 53/78  soft-tissue]
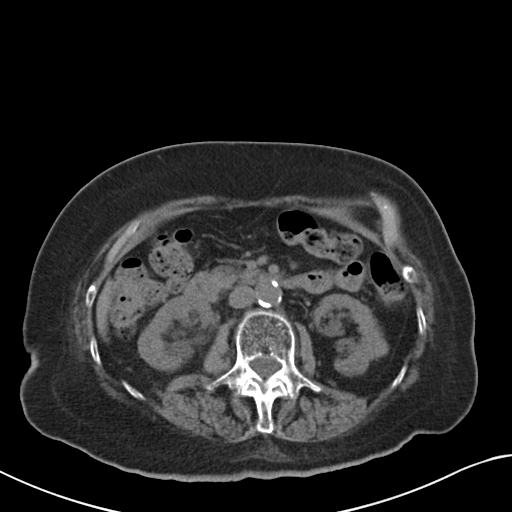
[im 53/78  bone]
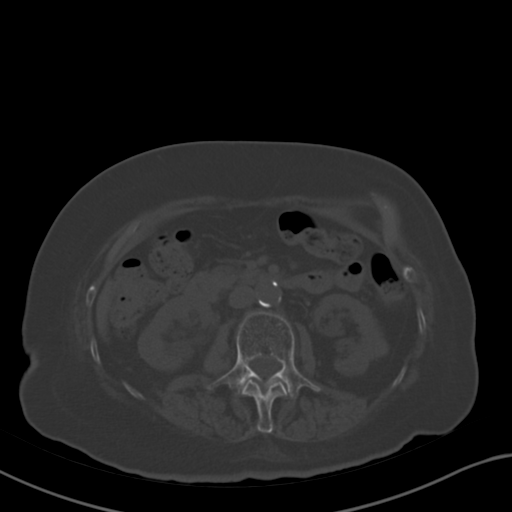
[im 61/78  soft-tissue]
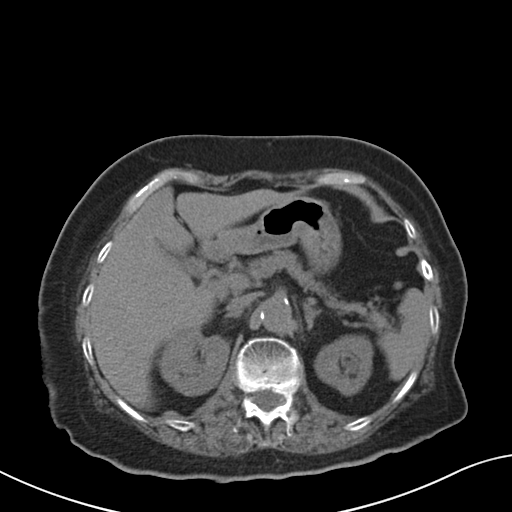
[im 65/78  soft-tissue]
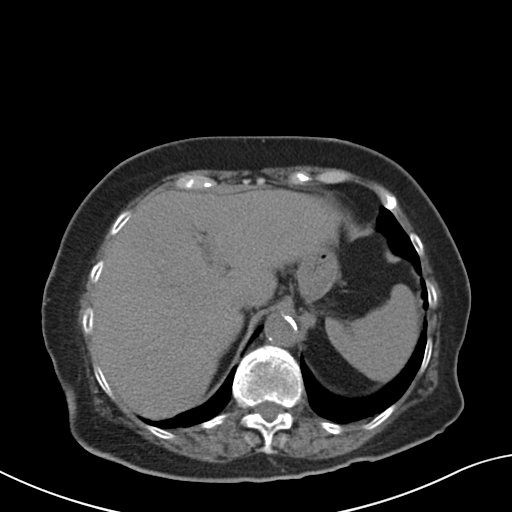
[im 73/78  soft-tissue]
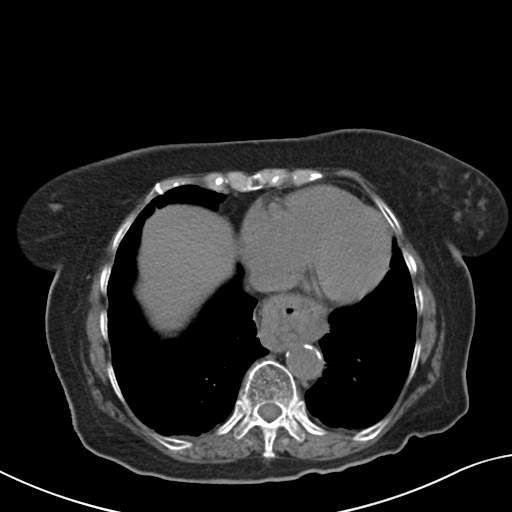

[Series 5: coronal · coronal · 0.73mm/px · 3 of 130 slices shown]
[im 44/130  soft-tissue]
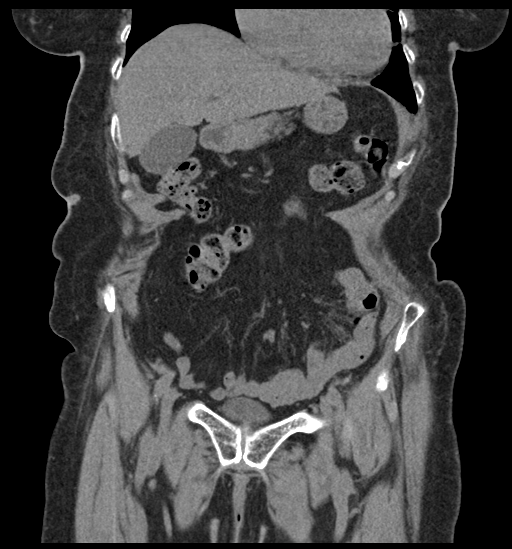
[im 58/130  soft-tissue]
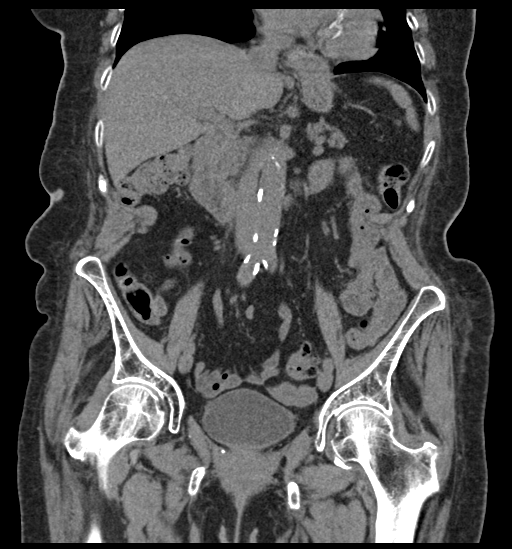
[im 72/130  soft-tissue]
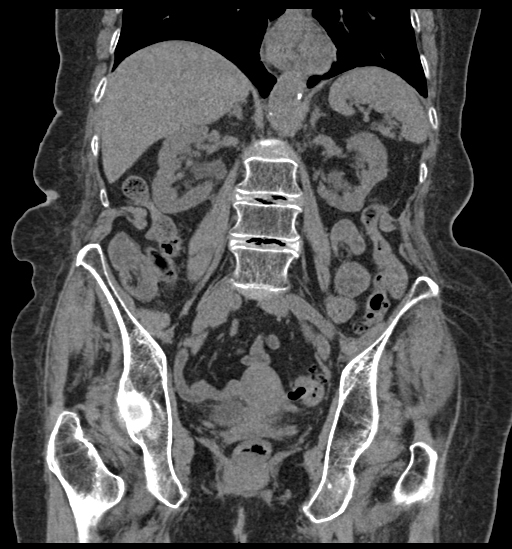

[15 of 46 positions shown; findings below may reference images not displayed]

FINDINGS: Lower chest: Small to moderate hiatal hernia. 3 mm right lower lobe
pulmonary nodule identified right lower lobe (image 11 series 4).

Hepatobiliary: No focal abnormality in the liver on this study
without intravenous contrast. There is no evidence for gallstones,
gallbladder wall thickening, or pericholecystic fluid. No
intrahepatic or extrahepatic biliary dilation.

Pancreas: No focal mass lesion. No dilatation of the main duct. No
intraparenchymal cyst. No peripancreatic edema.

Spleen: No splenomegaly. No focal mass lesion.

Adrenals/Urinary Tract: No adrenal nodule or mass. 3 mm
nonobstructing stone identified lower pole right kidney. Cortical
thinning noted in both kidneys, left greater than right. Central
sinus cysts in the kidneys confirmed on prior imaging. Possible 12
mm hyperattenuating soft tissue lesion in the interpolar left kidney
(image 26 series 2). No evidence for hydroureter. The urinary
bladder appears normal for the degree of distention.

Stomach/Bowel: Small to moderate hiatal hernia. Stomach otherwise
unremarkable. Duodenum is normally positioned as is the ligament of
Treitz. No small bowel wall thickening. No small bowel dilatation.
The terminal ileum is normal. The appendix is not visualized, but
there is no edema or inflammation in the region of the cecum.
Sigmoid colon diverticulosis noted N although the wall appears
somewhat ill-defined on axial imaging, pericolonic edema/
inflammation cannot be confirmed on coronal or sagittal imaging to
suggest the presence of acute diverticulitis.

Vascular/Lymphatic: There is abdominal aortic atherosclerosis
without aneurysm. There is no gastrohepatic or hepatoduodenal
ligament lymphadenopathy. No intraperitoneal or retroperitoneal
lymphadenopathy. No pelvic sidewall lymphadenopathy.

Reproductive: The uterus has normal CT imaging appearance. There is
no adnexal mass.

Other: No intraperitoneal free fluid.

Musculoskeletal: Bone windows reveal no worrisome lytic or sclerotic
osseous lesions.
IMPRESSION: 1. No acute findings in the abdomen or pelvis. Specifically, no
findings to explain the patient's history of right upper quadrant
pain with nausea and vomiting.
2. Small to moderate hiatal hernia.
3. 12 mm possible soft tissue lesion in the interpolar left kidney.
MRI without and with contrast would be the study of choice to
further evaluate when clinically warranted. This could be performed
as an outpatient exam after the patient recovers from acute symptoms
and can best cooperate with positioning and breath holding.
4. Left colonic diverticulosis without definite diverticulitis.
5. 3 mm nonobstructing right renal stone.
6. 3 mm right lower lobe pulmonary nodule. No follow-up needed if
patient is low-risk. Non-contrast chest CT can be considered in 12
months if patient is high-risk. This recommendation follows the
consensus statement: Guidelines for Management of Incidental
Pulmonary Nodules Detected on CT Images: From the [HOSPITAL]
7.  Aortic Atherosclerois ([id number]-170.0)

## 2019-11-05 NOTE — Telephone Encounter (Signed)
Pt son contacted and he stated that he has 3 calls about this this week and that his mother is not the one that needs the commode. I apologized and stated that I will make sure that this is documented.

## 2019-11-10 ENCOUNTER — Other Ambulatory Visit: Payer: Self-pay | Admitting: *Deleted

## 2019-11-10 DIAGNOSIS — R300 Dysuria: Secondary | ICD-10-CM

## 2019-11-10 LAB — URINALYSIS, ROUTINE W REFLEX MICROSCOPIC
Bilirubin Urine: NEGATIVE
Hgb urine dipstick: NEGATIVE
Ketones, ur: NEGATIVE
Nitrite: NEGATIVE
Specific Gravity, Urine: 1.02 (ref 1.000–1.030)
Total Protein, Urine: NEGATIVE
Urine Glucose: NEGATIVE
Urobilinogen, UA: 0.2 (ref 0.0–1.0)
pH: 6 (ref 5.0–8.0)

## 2019-11-10 NOTE — Addendum Note (Signed)
Addended by: Isaiah Serge D on: 11/10/2019 10:10 AM   Modules accepted: Orders

## 2019-11-12 ENCOUNTER — Encounter: Payer: Self-pay | Admitting: Internal Medicine

## 2019-11-12 LAB — URINE CULTURE

## 2019-11-16 ENCOUNTER — Telehealth: Payer: Self-pay

## 2019-11-16 NOTE — Telephone Encounter (Signed)
Son given u/a results, verbalizes understanding.

## 2019-12-07 ENCOUNTER — Other Ambulatory Visit: Payer: Self-pay | Admitting: Internal Medicine

## 2019-12-07 NOTE — Telephone Encounter (Signed)
Please refill as per office routine med refill policy (all routine meds refilled for 3 mo or monthly per pt preference up to one year from last visit, then month to month grace period for 3 mo, then further med refills will have to be denied)  

## 2019-12-27 ENCOUNTER — Telehealth: Payer: Self-pay

## 2019-12-27 NOTE — Telephone Encounter (Signed)
New message    Son calling   Needing a letter from Dr. Jenny Reichmann stating in his medical opinion Emily Spence is alert and oriented is able to make changes to her estate planning.

## 2019-12-27 NOTE — Telephone Encounter (Signed)
Ok for letter

## 2019-12-28 NOTE — Telephone Encounter (Signed)
Letter is pended for you to review, if you do not mind read over it and let me know if it sounds okay.

## 2019-12-29 DIAGNOSIS — R41841 Cognitive communication deficit: Secondary | ICD-10-CM | POA: Diagnosis not present

## 2019-12-29 DIAGNOSIS — R4182 Altered mental status, unspecified: Secondary | ICD-10-CM | POA: Diagnosis not present

## 2019-12-29 NOTE — Telephone Encounter (Signed)
Pt called back and requested letter to be placed up front. Letter has been placed up front.

## 2019-12-29 NOTE — Telephone Encounter (Signed)
Note printed hardcopy to ms tatum

## 2019-12-29 NOTE — Telephone Encounter (Signed)
LVM informing that letter was ready to pick or mail and to call us back and let us know.

## 2020-01-04 DIAGNOSIS — R4182 Altered mental status, unspecified: Secondary | ICD-10-CM | POA: Diagnosis not present

## 2020-01-04 DIAGNOSIS — R41841 Cognitive communication deficit: Secondary | ICD-10-CM | POA: Diagnosis not present

## 2020-01-06 DIAGNOSIS — R41841 Cognitive communication deficit: Secondary | ICD-10-CM | POA: Diagnosis not present

## 2020-01-06 DIAGNOSIS — R4182 Altered mental status, unspecified: Secondary | ICD-10-CM | POA: Diagnosis not present

## 2020-01-11 DIAGNOSIS — R4182 Altered mental status, unspecified: Secondary | ICD-10-CM | POA: Diagnosis not present

## 2020-01-11 DIAGNOSIS — R41841 Cognitive communication deficit: Secondary | ICD-10-CM | POA: Diagnosis not present

## 2020-01-13 DIAGNOSIS — R41841 Cognitive communication deficit: Secondary | ICD-10-CM | POA: Diagnosis not present

## 2020-01-13 DIAGNOSIS — R4182 Altered mental status, unspecified: Secondary | ICD-10-CM | POA: Diagnosis not present

## 2020-01-18 DIAGNOSIS — R41841 Cognitive communication deficit: Secondary | ICD-10-CM | POA: Diagnosis not present

## 2020-01-18 DIAGNOSIS — R4182 Altered mental status, unspecified: Secondary | ICD-10-CM | POA: Diagnosis not present

## 2020-01-20 DIAGNOSIS — R41841 Cognitive communication deficit: Secondary | ICD-10-CM | POA: Diagnosis not present

## 2020-01-20 DIAGNOSIS — R4182 Altered mental status, unspecified: Secondary | ICD-10-CM | POA: Diagnosis not present

## 2020-01-25 ENCOUNTER — Other Ambulatory Visit: Payer: Self-pay

## 2020-01-25 ENCOUNTER — Encounter: Payer: Self-pay | Admitting: Podiatry

## 2020-01-25 ENCOUNTER — Ambulatory Visit (INDEPENDENT_AMBULATORY_CARE_PROVIDER_SITE_OTHER): Payer: Medicare Other | Admitting: Podiatry

## 2020-01-25 VITALS — Temp 97.5°F

## 2020-01-25 DIAGNOSIS — M79674 Pain in right toe(s): Secondary | ICD-10-CM | POA: Diagnosis not present

## 2020-01-25 DIAGNOSIS — B351 Tinea unguium: Secondary | ICD-10-CM | POA: Diagnosis not present

## 2020-01-25 DIAGNOSIS — M79675 Pain in left toe(s): Secondary | ICD-10-CM | POA: Diagnosis not present

## 2020-01-25 NOTE — Progress Notes (Signed)
This patient returns to the office for evaluation and treatment of long thick painful nails .  This patient is unable to trim his own nails since the patient cannot reach the feet.  Patient says the nails are painful wearing his shoes. She has not been seen in over 2 years.  She presents to the office with her son for treatment.   Shee returns for preventive foot care services.  General Appearance  Alert, conversant and in no acute stress.  Vascular  Dorsalis pedis and posterior tibial  pulses are palpable  bilaterally.  Capillary return is within normal limits  bilaterally. Temperature is within normal limits  bilaterally.  Neurologic  Senn-Weinstein monofilament wire test within normal limits  bilaterally. Muscle power within normal limits bilaterally.  Nails Thick disfigured discolored nails with subungual debris  from hallux to fifth toes bilaterally. No evidence of bacterial infection or drainage bilaterally.  Orthopedic  No limitations of motion  feet .  No crepitus or effusions noted.  No bony pathology or digital deformities noted. HAV  B/L.    Skin  normotropic skin with no porokeratosis noted bilaterally.  No signs of infections or ulcers noted.     Onychomycosis  Pain in toes right foot  Pain in toes left foot  Debridement  of nails  1-5  B/L with a nail nipper.  Nails were then filed using a dremel tool with no incidents.    RTC   Prn.   Gardiner Barefoot DPM

## 2020-01-27 DIAGNOSIS — R41841 Cognitive communication deficit: Secondary | ICD-10-CM | POA: Diagnosis not present

## 2020-01-27 DIAGNOSIS — Z9181 History of falling: Secondary | ICD-10-CM | POA: Diagnosis not present

## 2020-01-27 DIAGNOSIS — R2681 Unsteadiness on feet: Secondary | ICD-10-CM | POA: Diagnosis not present

## 2020-01-27 DIAGNOSIS — R4182 Altered mental status, unspecified: Secondary | ICD-10-CM | POA: Diagnosis not present

## 2020-01-27 DIAGNOSIS — I10 Essential (primary) hypertension: Secondary | ICD-10-CM | POA: Diagnosis not present

## 2020-01-27 DIAGNOSIS — G8929 Other chronic pain: Secondary | ICD-10-CM | POA: Diagnosis not present

## 2020-01-27 DIAGNOSIS — M6281 Muscle weakness (generalized): Secondary | ICD-10-CM | POA: Diagnosis not present

## 2020-01-27 DIAGNOSIS — R2689 Other abnormalities of gait and mobility: Secondary | ICD-10-CM | POA: Diagnosis not present

## 2020-01-28 DIAGNOSIS — Z9181 History of falling: Secondary | ICD-10-CM | POA: Diagnosis not present

## 2020-01-28 DIAGNOSIS — R2681 Unsteadiness on feet: Secondary | ICD-10-CM | POA: Diagnosis not present

## 2020-01-28 DIAGNOSIS — I10 Essential (primary) hypertension: Secondary | ICD-10-CM | POA: Diagnosis not present

## 2020-01-28 DIAGNOSIS — G8929 Other chronic pain: Secondary | ICD-10-CM | POA: Diagnosis not present

## 2020-01-28 DIAGNOSIS — R2689 Other abnormalities of gait and mobility: Secondary | ICD-10-CM | POA: Diagnosis not present

## 2020-01-28 DIAGNOSIS — R4182 Altered mental status, unspecified: Secondary | ICD-10-CM | POA: Diagnosis not present

## 2020-01-31 DIAGNOSIS — R4182 Altered mental status, unspecified: Secondary | ICD-10-CM | POA: Diagnosis not present

## 2020-01-31 DIAGNOSIS — Z9181 History of falling: Secondary | ICD-10-CM | POA: Diagnosis not present

## 2020-01-31 DIAGNOSIS — G8929 Other chronic pain: Secondary | ICD-10-CM | POA: Diagnosis not present

## 2020-01-31 DIAGNOSIS — R2689 Other abnormalities of gait and mobility: Secondary | ICD-10-CM | POA: Diagnosis not present

## 2020-01-31 DIAGNOSIS — I10 Essential (primary) hypertension: Secondary | ICD-10-CM | POA: Diagnosis not present

## 2020-01-31 DIAGNOSIS — R2681 Unsteadiness on feet: Secondary | ICD-10-CM | POA: Diagnosis not present

## 2020-02-01 DIAGNOSIS — R2689 Other abnormalities of gait and mobility: Secondary | ICD-10-CM | POA: Diagnosis not present

## 2020-02-01 DIAGNOSIS — R2681 Unsteadiness on feet: Secondary | ICD-10-CM | POA: Diagnosis not present

## 2020-02-01 DIAGNOSIS — R4182 Altered mental status, unspecified: Secondary | ICD-10-CM | POA: Diagnosis not present

## 2020-02-01 DIAGNOSIS — G8929 Other chronic pain: Secondary | ICD-10-CM | POA: Diagnosis not present

## 2020-02-01 DIAGNOSIS — Z9181 History of falling: Secondary | ICD-10-CM | POA: Diagnosis not present

## 2020-02-01 DIAGNOSIS — I10 Essential (primary) hypertension: Secondary | ICD-10-CM | POA: Diagnosis not present

## 2020-02-03 DIAGNOSIS — I10 Essential (primary) hypertension: Secondary | ICD-10-CM | POA: Diagnosis not present

## 2020-02-03 DIAGNOSIS — R2689 Other abnormalities of gait and mobility: Secondary | ICD-10-CM | POA: Diagnosis not present

## 2020-02-03 DIAGNOSIS — Z9181 History of falling: Secondary | ICD-10-CM | POA: Diagnosis not present

## 2020-02-03 DIAGNOSIS — R4182 Altered mental status, unspecified: Secondary | ICD-10-CM | POA: Diagnosis not present

## 2020-02-03 DIAGNOSIS — R2681 Unsteadiness on feet: Secondary | ICD-10-CM | POA: Diagnosis not present

## 2020-02-03 DIAGNOSIS — G8929 Other chronic pain: Secondary | ICD-10-CM | POA: Diagnosis not present

## 2020-02-07 DIAGNOSIS — I10 Essential (primary) hypertension: Secondary | ICD-10-CM | POA: Diagnosis not present

## 2020-02-07 DIAGNOSIS — G8929 Other chronic pain: Secondary | ICD-10-CM | POA: Diagnosis not present

## 2020-02-07 DIAGNOSIS — R2689 Other abnormalities of gait and mobility: Secondary | ICD-10-CM | POA: Diagnosis not present

## 2020-02-07 DIAGNOSIS — R4182 Altered mental status, unspecified: Secondary | ICD-10-CM | POA: Diagnosis not present

## 2020-02-07 DIAGNOSIS — Z9181 History of falling: Secondary | ICD-10-CM | POA: Diagnosis not present

## 2020-02-07 DIAGNOSIS — R2681 Unsteadiness on feet: Secondary | ICD-10-CM | POA: Diagnosis not present

## 2020-02-08 DIAGNOSIS — G8929 Other chronic pain: Secondary | ICD-10-CM | POA: Diagnosis not present

## 2020-02-08 DIAGNOSIS — R2681 Unsteadiness on feet: Secondary | ICD-10-CM | POA: Diagnosis not present

## 2020-02-08 DIAGNOSIS — R2689 Other abnormalities of gait and mobility: Secondary | ICD-10-CM | POA: Diagnosis not present

## 2020-02-08 DIAGNOSIS — I10 Essential (primary) hypertension: Secondary | ICD-10-CM | POA: Diagnosis not present

## 2020-02-08 DIAGNOSIS — R4182 Altered mental status, unspecified: Secondary | ICD-10-CM | POA: Diagnosis not present

## 2020-02-08 DIAGNOSIS — Z9181 History of falling: Secondary | ICD-10-CM | POA: Diagnosis not present

## 2020-02-09 DIAGNOSIS — I10 Essential (primary) hypertension: Secondary | ICD-10-CM | POA: Diagnosis not present

## 2020-02-09 DIAGNOSIS — R2689 Other abnormalities of gait and mobility: Secondary | ICD-10-CM | POA: Diagnosis not present

## 2020-02-09 DIAGNOSIS — Z9181 History of falling: Secondary | ICD-10-CM | POA: Diagnosis not present

## 2020-02-09 DIAGNOSIS — G8929 Other chronic pain: Secondary | ICD-10-CM | POA: Diagnosis not present

## 2020-02-09 DIAGNOSIS — R4182 Altered mental status, unspecified: Secondary | ICD-10-CM | POA: Diagnosis not present

## 2020-02-09 DIAGNOSIS — R2681 Unsteadiness on feet: Secondary | ICD-10-CM | POA: Diagnosis not present

## 2020-02-10 DIAGNOSIS — R2681 Unsteadiness on feet: Secondary | ICD-10-CM | POA: Diagnosis not present

## 2020-02-10 DIAGNOSIS — G8929 Other chronic pain: Secondary | ICD-10-CM | POA: Diagnosis not present

## 2020-02-10 DIAGNOSIS — I10 Essential (primary) hypertension: Secondary | ICD-10-CM | POA: Diagnosis not present

## 2020-02-10 DIAGNOSIS — R2689 Other abnormalities of gait and mobility: Secondary | ICD-10-CM | POA: Diagnosis not present

## 2020-02-10 DIAGNOSIS — R4182 Altered mental status, unspecified: Secondary | ICD-10-CM | POA: Diagnosis not present

## 2020-02-10 DIAGNOSIS — Z9181 History of falling: Secondary | ICD-10-CM | POA: Diagnosis not present

## 2020-02-14 DIAGNOSIS — R2689 Other abnormalities of gait and mobility: Secondary | ICD-10-CM | POA: Diagnosis not present

## 2020-02-14 DIAGNOSIS — R4182 Altered mental status, unspecified: Secondary | ICD-10-CM | POA: Diagnosis not present

## 2020-02-14 DIAGNOSIS — Z9181 History of falling: Secondary | ICD-10-CM | POA: Diagnosis not present

## 2020-02-14 DIAGNOSIS — I10 Essential (primary) hypertension: Secondary | ICD-10-CM | POA: Diagnosis not present

## 2020-02-14 DIAGNOSIS — G8929 Other chronic pain: Secondary | ICD-10-CM | POA: Diagnosis not present

## 2020-02-14 DIAGNOSIS — R2681 Unsteadiness on feet: Secondary | ICD-10-CM | POA: Diagnosis not present

## 2020-02-15 DIAGNOSIS — R2689 Other abnormalities of gait and mobility: Secondary | ICD-10-CM | POA: Diagnosis not present

## 2020-02-15 DIAGNOSIS — G8929 Other chronic pain: Secondary | ICD-10-CM | POA: Diagnosis not present

## 2020-02-15 DIAGNOSIS — Z9181 History of falling: Secondary | ICD-10-CM | POA: Diagnosis not present

## 2020-02-15 DIAGNOSIS — R4182 Altered mental status, unspecified: Secondary | ICD-10-CM | POA: Diagnosis not present

## 2020-02-15 DIAGNOSIS — R2681 Unsteadiness on feet: Secondary | ICD-10-CM | POA: Diagnosis not present

## 2020-02-15 DIAGNOSIS — I10 Essential (primary) hypertension: Secondary | ICD-10-CM | POA: Diagnosis not present

## 2020-02-17 DIAGNOSIS — G8929 Other chronic pain: Secondary | ICD-10-CM | POA: Diagnosis not present

## 2020-02-17 DIAGNOSIS — I10 Essential (primary) hypertension: Secondary | ICD-10-CM | POA: Diagnosis not present

## 2020-02-17 DIAGNOSIS — R2689 Other abnormalities of gait and mobility: Secondary | ICD-10-CM | POA: Diagnosis not present

## 2020-02-17 DIAGNOSIS — Z9181 History of falling: Secondary | ICD-10-CM | POA: Diagnosis not present

## 2020-02-17 DIAGNOSIS — R2681 Unsteadiness on feet: Secondary | ICD-10-CM | POA: Diagnosis not present

## 2020-02-17 DIAGNOSIS — R4182 Altered mental status, unspecified: Secondary | ICD-10-CM | POA: Diagnosis not present

## 2020-04-26 ENCOUNTER — Emergency Department (HOSPITAL_COMMUNITY)
Admission: EM | Admit: 2020-04-26 | Discharge: 2020-04-26 | Disposition: A | Discharge status: Home / Self Care | Payer: Medicare Other | Attending: Emergency Medicine | Admitting: Emergency Medicine

## 2020-04-26 ENCOUNTER — Emergency Department (HOSPITAL_COMMUNITY): Payer: Medicare Other

## 2020-04-26 DIAGNOSIS — Y939 Activity, unspecified: Secondary | ICD-10-CM | POA: Insufficient documentation

## 2020-04-26 DIAGNOSIS — Y92009 Unspecified place in unspecified non-institutional (private) residence as the place of occurrence of the external cause: Secondary | ICD-10-CM | POA: Diagnosis not present

## 2020-04-26 DIAGNOSIS — E039 Hypothyroidism, unspecified: Secondary | ICD-10-CM | POA: Insufficient documentation

## 2020-04-26 DIAGNOSIS — I10 Essential (primary) hypertension: Secondary | ICD-10-CM | POA: Diagnosis not present

## 2020-04-26 DIAGNOSIS — M25551 Pain in right hip: Secondary | ICD-10-CM | POA: Insufficient documentation

## 2020-04-26 DIAGNOSIS — Z87891 Personal history of nicotine dependence: Secondary | ICD-10-CM | POA: Diagnosis not present

## 2020-04-26 DIAGNOSIS — Y999 Unspecified external cause status: Secondary | ICD-10-CM | POA: Diagnosis not present

## 2020-04-26 DIAGNOSIS — Z79890 Hormone replacement therapy (postmenopausal): Secondary | ICD-10-CM | POA: Insufficient documentation

## 2020-04-26 DIAGNOSIS — Z79899 Other long term (current) drug therapy: Secondary | ICD-10-CM | POA: Diagnosis not present

## 2020-04-26 DIAGNOSIS — W19XXXA Unspecified fall, initial encounter: Secondary | ICD-10-CM

## 2020-04-26 DIAGNOSIS — F0390 Unspecified dementia without behavioral disturbance: Secondary | ICD-10-CM | POA: Insufficient documentation

## 2020-04-26 DIAGNOSIS — W06XXXA Fall from bed, initial encounter: Secondary | ICD-10-CM | POA: Diagnosis not present

## 2020-04-26 LAB — URINALYSIS, ROUTINE W REFLEX MICROSCOPIC
Bilirubin Urine: NEGATIVE
Glucose, UA: NEGATIVE mg/dL
Hgb urine dipstick: NEGATIVE
Ketones, ur: NEGATIVE mg/dL
Leukocytes,Ua: NEGATIVE
Nitrite: NEGATIVE
Protein, ur: NEGATIVE mg/dL
Specific Gravity, Urine: 1.015 (ref 1.005–1.030)
pH: 8 (ref 5.0–8.0)

## 2020-04-26 LAB — CBC WITH DIFFERENTIAL/PLATELET
Abs Immature Granulocytes: 0.04 10*3/uL (ref 0.00–0.07)
Basophils Absolute: 0 10*3/uL (ref 0.0–0.1)
Basophils Relative: 0 %
Eosinophils Absolute: 0.1 10*3/uL (ref 0.0–0.5)
Eosinophils Relative: 1 %
HCT: 43.8 % (ref 36.0–46.0)
Hemoglobin: 14.4 g/dL (ref 12.0–15.0)
Immature Granulocytes: 0 %
Lymphocytes Relative: 14 %
Lymphs Abs: 1.3 10*3/uL (ref 0.7–4.0)
MCH: 31.2 pg (ref 26.0–34.0)
MCHC: 32.9 g/dL (ref 30.0–36.0)
MCV: 94.8 fL (ref 80.0–100.0)
Monocytes Absolute: 0.9 10*3/uL (ref 0.1–1.0)
Monocytes Relative: 10 %
Neutro Abs: 7.2 10*3/uL (ref 1.7–7.7)
Neutrophils Relative %: 75 %
Platelets: 143 10*3/uL — ABNORMAL LOW (ref 150–400)
RBC: 4.62 MIL/uL (ref 3.87–5.11)
RDW: 14.4 % (ref 11.5–15.5)
WBC: 9.5 10*3/uL (ref 4.0–10.5)
nRBC: 0 % (ref 0.0–0.2)

## 2020-04-26 LAB — BASIC METABOLIC PANEL
Anion gap: 12 (ref 5–15)
BUN: 22 mg/dL (ref 8–23)
CO2: 26 mmol/L (ref 22–32)
Calcium: 9.3 mg/dL (ref 8.9–10.3)
Chloride: 103 mmol/L (ref 98–111)
Creatinine, Ser: 0.78 mg/dL (ref 0.44–1.00)
GFR calc Af Amer: >60 mL/min (ref >60)
GFR calc non Af Amer: >60 mL/min (ref >60)
Glucose, Bld: 125 mg/dL — ABNORMAL HIGH (ref 70–99)
Potassium: 3.9 mmol/L (ref 3.5–5.1)
Sodium: 141 mmol/L (ref 135–145)

## 2020-04-26 NOTE — ED Provider Notes (Signed)
Medical screening examination/treatment/procedure(s) were conducted as a shared visit with non-physician practitioner(s) and myself.  I personally evaluated the patient during the encounter.  EKG Interpretation  Date/Time:  Wednesday April 26 2020 09:59:25 EDT Ventricular Rate:  89 PR Interval:    QRS Duration: 131 QT Interval:  402 QTC Calculation: 490 R Axis:   3 Text Interpretation: Sinus rhythm Right bundle branch block Interpretation limited secondary to artifact Confirmed by Fredia Sorrow [phone]) on 04/26/2020 10:07:58 AM   Patient seen by me along with the physician assistant.  Patient brought in by [facility] from the [facility] patient is independent living at [place].  There is a presumed fall son found patient at 6 AM on the floor beside her bed.  Patient has no complaints.  Patient initially reported right hip pain.  Is a history of dementia.  Patient without any obvious injury.  Labs without significant abnormalities.  We attempted to get hip x-ray along with pelvis but patient got combative in the x-ray suite.  Her son is here now also he should help with calming her down.  Do not feel sedation is necessary just was in to see her and she is very cooperative.  Will retry to reshoot the x-ray.  Patient's EKG had no acute changes.  She has had right bundle branch block in the past.  No anemia white blood cell counts normal basic electrolytes without abnormalities.  If x-ray is negative will attempt to ambulate.  If she is able to ambulate at her baseline then she can be discharged back.   Fredia Sorrow, MD 04/26/20 1100

## 2020-04-26 NOTE — ED Notes (Signed)
Transported to XR at this time  

## 2020-04-26 NOTE — ED Provider Notes (Signed)
Emily Spence Provider Note   CSN: [id number] Arrival date & time: 04/26/20  5852     History Chief Complaint  Patient presents with  . Fall    Emily Spence is a 86 y.o. female.  The history is provided by the EMS personnel, the nursing home and the patient. No language interpreter was used.  Fall     86 year old female with history of dementia, osteoporosis, anemia, brought here via EMS from [facility] independent living for a presumed fall.  Per EMS, patient son found patient on the floor beside her bed at 6 AM this morning.  Nurse at her facility report patient initially reported right hip pain prior to arrival.  Patient has history of dementia, history is limited as patient reports she does not have any symptoms and would like to go home.  When asked if she has any headache, neck pain, chest pain, abdominal pain, hip pain or pain to extremities, patient denies.  She did not recall falling.  She does not complain of any back pain.She is not on any blood thinner medication.  She did not complain of any dysuria.  Level 5 caveat due to dementia.  Past Medical History:  Diagnosis Date  . Allergic rhinitis   . Anemia   . Closed 4-part fracture of proximal end of right humerus 12/01/2014  . Closed compression fracture of L1 lumbar vertebra   . Female cystocele   . HLD (hyperlipidemia)   . Hyperglycemia 09/05/2017  . Hypertension   . Hypothyroidism   . Osteoporosis   . RBBB   . Thyroid disease   . Urge urinary incontinence     Patient Active Problem List   Diagnosis Date Noted  . Dysuria 10/28/2019  . Memory loss 06/05/2018  . HLD (hyperlipidemia)   . Urge urinary incontinence   . RBBB   . Osteoporosis   . Female cystocele   . Closed compression fracture of L1 lumbar vertebra   . Hyperglycemia 09/05/2017  . Preventative health care 09/05/2017  . Abdominal mass 09/05/2017  . Anemia 09/05/2017  . Hypertension   . Hypothyroidism     . Allergic rhinitis   . Fracture of humerus, proximal, right, closed 12/09/2014  . Closed 4-part fracture of proximal end of right humerus 12/01/2014    Past Surgical History:  Procedure Laterality Date  . APPENDECTOMY    . CATARACT EXTRACTION W/ INTRAOCULAR LENS  IMPLANT, BILATERAL    . REVERSE SHOULDER ARTHROPLASTY Right 12/09/2014   Procedure: RIGHT REVERSE SHOULDER ARTHROPLASTY;  Surgeon: Johnny Bridge, MD;  Location: [facility];  Service: Orthopedics;  Laterality: Right;  . TONSILLECTOMY       OB History   No obstetric history on file.     Family History  Problem Relation Age of Onset  . Depression Mother     Social History   Tobacco Use  . Smoking status: Former Research scientist (life sciences)  . Smokeless tobacco: Never Used  Substance Use Topics  . Alcohol use: No  . Drug use: No    Home Medications Prior to Admission medications   Medication Sig Start Date End Date Taking? Authorizing Provider  amLODipine (NORVASC) 2.5 MG tablet TAKE 1 TABLET BY MOUTH  DAILY 12/07/19   Biagio Borg, MD  cholecalciferol (VITAMIN D) 1000 units tablet Take 1,000 Units by mouth daily.    [provider]  donepezil (ARICEPT) 5 MG tablet TAKE 1 TABLET BY MOUTH AT  BEDTIME 12/07/19   Cathlean Cower  W, MD  levothyroxine (SYNTHROID) 50 MCG tablet TAKE 1 TABLET BY MOUTH  DAILY EXCEPT 1 AND 1/2  TABLETS BY MOUTH ON TUESDAY AND THURSDAY 12/07/19   Biagio Borg, MD    Allergies    Simvastatin and Sulfur  Review of Systems   Review of Systems  Unable to perform ROS: Dementia    Physical Exam Updated Vital Signs There were no vitals taken for this visit.  Physical Exam Vitals and nursing note reviewed.  Constitutional:      General: She is not in acute distress.    Appearance: She is well-developed.     Comments: Alert, sitting in bed in no acute discomfort.  HENT:     Head: Normocephalic and atraumatic.  Eyes:     Extraocular Movements: Extraocular movements intact.     Conjunctiva/sclera:  Conjunctivae normal.     Pupils: Pupils are equal, round, and reactive to light.  Cardiovascular:     Rate and Rhythm: Normal rate and regular rhythm.     Pulses: Normal pulses.     Heart sounds: Normal heart sounds.  Pulmonary:     Effort: Pulmonary effort is normal.     Breath sounds: Normal breath sounds.  Abdominal:     Palpations: Abdomen is soft.     Tenderness: There is no abdominal tenderness.  Musculoskeletal:     Cervical back: Normal range of motion and neck supple. No tenderness.     Comments: No tenderness to bilateral hip and pelvis on palpation.  No tenderness to all 4 extremities without any signs of deformity.  Skin:    Findings: No rash.  Neurological:     Mental Status: She is alert. She is confused.     Comments: Moving all 4 extremities.     ED Results / Procedures / Treatments   Labs (all labs ordered are listed, but only abnormal results are displayed) Labs Reviewed  BASIC METABOLIC PANEL - Abnormal; Notable for the following components:      Result Value   Glucose, Bld 125 (*)    All other components within normal limits  CBC WITH DIFFERENTIAL/PLATELET - Abnormal; Notable for the following components:   Platelets 143 (*)    All other components within normal limits  URINALYSIS, ROUTINE W REFLEX MICROSCOPIC  CBG MONITORING, ED    EKG EKG Interpretation  Date/Time:  Wednesday April 26 2020 09:59:25 EDT Ventricular Rate:  89 PR Interval:    QRS Duration: 131 QT Interval:  402 QTC Calculation: 490 R Axis:   3 Text Interpretation: Sinus rhythm Right bundle branch block Interpretation limited secondary to artifact Confirmed by Fredia Sorrow [phone]) on 04/26/2020 10:07:58 AM   Radiology DG Hip Unilat W or Wo Pelvis 2-3 Views Right  Result Date: 04/26/2020 CLINICAL DATA:  Fall, transported via EMS. EXAM: DG HIP (WITH OR WITHOUT PELVIS) 2-3V RIGHT COMPARISON:  CT from 2018 FINDINGS: Subtle sclerosis along the midportion of the superior pubic ramus  on the RIGHT. There is overlying bowel gas in this area, rectum with stool projecting in this location. RIGHT hip is located.  No signs of RIGHT proximal femoral fracture. Signs of lumbar spine degenerative change. IMPRESSION: 1. Subtle sclerosis along the midportion of the RIGHT superior pubic ramus. Findings could be seen in the setting of incomplete fracture. Limited assessment due to overlying bowel gas. 2. RIGHT hip without sign of fracture or dislocation. Electronically Signed   By: Zetta Bills M.D.   On: 04/26/2020 11:40  Procedures Procedures (including critical care time)  Medications Ordered in ED Medications - No data to display  ED Course  I have reviewed the triage vital signs and the nursing notes.  Pertinent labs & imaging results that were available during my care of the patient were reviewed by me and considered in my medical decision making (see chart for details).    MDM Rules/Calculators/A&P                          BP 133/78   Pulse 79   Temp (!) 97.2 F (36.2 C) (Oral)   Resp 18   SpO2 92%   Final Clinical Impression(s) / ED Diagnoses Final diagnoses:  Fall, initial encounter    Rx / DC Orders ED Discharge Orders    None     9:38 AM This is an elderly 86 year old female with history of dementia who was found laying next to her bed this morning by her son.  She lives at an independent living facility by herself.  Concern for potential fall.  Patient however appears to be in no acute discomfort, is confused as to why she is in the ER and reports she wants to go back home.  No obvious signs of trauma on initial exam.  Initial nursing note indicate patient did complain of right hip pain prior to arrival.  Will obtain x-ray of the right hip, EKG, UA, and basic labs.  1:11 PM Labs are reassuring.  Unable to obtain UA.  Xray of R hip/pelvis showing subtle sclerosis to R superior pubic ramus which may indicate an incomplete fx.  However, in assessment pt does  not have reproducible pain to R hip/pelvis and she's able to ambulate without difficulty.  I discussed finding with pt and with son who is at bedside.  Suspect this is likely an artifact but encourage repeat xray or CT scan if she experienced pain.  UA have not been obtained yet but pt and son request to be discharge.  Care disussed with Dr. Rogene Houston.    Domenic Moras, PA-C 04/26/20 1314    Fredia Sorrow, MD 04/27/20 [phone]

## 2020-04-26 NOTE — ED Notes (Signed)
Pt ambulated in hallway with two person assist. Was a little unsteady but was fine with assistance.

## 2020-04-26 NOTE — Discharge Instructions (Addendum)
You have been evaluated for a possible fall.  Xray showing a faint changes to your right pelvis which may indicate a potential break in the bone.  If you have persistent pain please have a repeat xray of your right hip and pelvis.  Follow up with your doctor for further care.

## 2020-04-26 NOTE — ED Triage Notes (Signed)
Transported by [employer] from [facility]) for a presumed fall. Son found patient at 6 am on the floor beside her bed. Patient has no complaints. RN on property stated that patient initially reports right hip pain prior to EMS arrival. History of dementia, AAO x 2. VSS with EMS, CBG 120 mg/dl.

## 2020-05-11 ENCOUNTER — Telehealth: Payer: Self-pay

## 2020-05-11 DIAGNOSIS — W19XXXA Unspecified fall, initial encounter: Secondary | ICD-10-CM

## 2020-05-11 NOTE — Telephone Encounter (Signed)
Letter sent by Arminda Resides, the [facility] Director/PTA, requesting PT orders for pt due to recent fall. Discipline to include Focus on Functional mobility and safety within her ILF apartment.   Fax number: [phone].   Order has been entered.

## 2020-05-12 NOTE — Telephone Encounter (Signed)
New Message:   Emily Spence is calling from [facility] to follow up on the orders for PT. She is asking that we fax the order to her or Marli at [phone]. Please advise.

## 2020-05-15 NOTE — Telephone Encounter (Signed)
Referral faxed to [facility] PT [phone]

## 2020-08-04 DIAGNOSIS — Z23 Encounter for immunization: Secondary | ICD-10-CM | POA: Diagnosis not present

## 2020-08-18 ENCOUNTER — Other Ambulatory Visit: Payer: Self-pay | Admitting: Internal Medicine

## 2020-08-18 NOTE — Telephone Encounter (Signed)
Please refill as per office routine med refill policy (all routine meds refilled for 3 mo or monthly per pt preference up to one year from last visit, then month to month grace period for 3 mo, then further med refills will have to be denied)  

## 2020-10-06 ENCOUNTER — Telehealth: Payer: Self-pay | Admitting: Internal Medicine

## 2020-10-06 NOTE — Telephone Encounter (Signed)
   Florentina Jenny from [employer] calling for status of FL2 form She states she dropped off forms to office  Please call [phone]

## 2020-10-09 NOTE — Telephone Encounter (Signed)
Follow up [place] from [employer] calling for FL2 status

## 2020-10-10 NOTE — Telephone Encounter (Signed)
Emily Spence has called again. I do not have these forms. They must of been placed in providers box.   [name] do you have this?

## 2020-10-10 NOTE — Telephone Encounter (Signed)
Form is completed&Signed, Copy sent to scan.   LVM for Florentina Jenny to inform it is ready to be picked up.

## 2020-10-17 DIAGNOSIS — L814 Other melanin hyperpigmentation: Secondary | ICD-10-CM | POA: Diagnosis not present

## 2020-10-17 DIAGNOSIS — D485 Neoplasm of uncertain behavior of skin: Secondary | ICD-10-CM | POA: Diagnosis not present

## 2020-10-17 DIAGNOSIS — L821 Other seborrheic keratosis: Secondary | ICD-10-CM | POA: Diagnosis not present

## 2020-12-08 ENCOUNTER — Other Ambulatory Visit: Payer: Self-pay | Admitting: Internal Medicine

## 2020-12-08 NOTE — Telephone Encounter (Signed)
Please refill as per office routine med refill policy (all routine meds refilled for 3 mo or monthly per pt preference up to one year from last visit, then month to month grace period for 3 mo, then further med refills will have to be denied)  

## 2020-12-11 NOTE — Telephone Encounter (Signed)
Yes, ok for rov if possible

## 2021-02-03 ENCOUNTER — Other Ambulatory Visit: Payer: Self-pay | Admitting: Internal Medicine

## 2021-02-03 NOTE — Telephone Encounter (Signed)
Please refill as per office routine med refill policy (all routine meds refilled for 3 mo or monthly per pt preference up to one year from last visit, then month to month grace period for 3 mo, then further med refills will have to be denied)  

## 2021-03-19 ENCOUNTER — Encounter (HOSPITAL_COMMUNITY): Payer: Self-pay | Admitting: Emergency Medicine

## 2021-03-19 ENCOUNTER — Emergency Department (HOSPITAL_COMMUNITY): Payer: Medicare Other

## 2021-03-19 ENCOUNTER — Other Ambulatory Visit: Payer: Self-pay

## 2021-03-19 ENCOUNTER — Inpatient Hospital Stay (HOSPITAL_COMMUNITY)
Admission: EM | Admit: 2021-03-19 | Discharge: 2021-03-22 | DRG: 189 | Disposition: A | Discharge status: Skilled Nursing Facility | Payer: Medicare Other | Attending: Internal Medicine | Admitting: Internal Medicine

## 2021-03-19 DIAGNOSIS — Z96611 Presence of right artificial shoulder joint: Secondary | ICD-10-CM | POA: Diagnosis present

## 2021-03-19 DIAGNOSIS — R4182 Altered mental status, unspecified: Secondary | ICD-10-CM | POA: Diagnosis present

## 2021-03-19 DIAGNOSIS — Z515 Encounter for palliative care: Secondary | ICD-10-CM

## 2021-03-19 DIAGNOSIS — I10 Essential (primary) hypertension: Secondary | ICD-10-CM | POA: Diagnosis present

## 2021-03-19 DIAGNOSIS — W19XXXA Unspecified fall, initial encounter: Secondary | ICD-10-CM

## 2021-03-19 DIAGNOSIS — E039 Hypothyroidism, unspecified: Secondary | ICD-10-CM | POA: Diagnosis present

## 2021-03-19 DIAGNOSIS — Z66 Do not resuscitate: Secondary | ICD-10-CM | POA: Diagnosis present

## 2021-03-19 DIAGNOSIS — R296 Repeated falls: Secondary | ICD-10-CM

## 2021-03-19 DIAGNOSIS — J9601 Acute respiratory failure with hypoxia: Secondary | ICD-10-CM | POA: Diagnosis not present

## 2021-03-19 DIAGNOSIS — Z20822 Contact with and (suspected) exposure to covid-19: Secondary | ICD-10-CM | POA: Diagnosis present

## 2021-03-19 DIAGNOSIS — Y92009 Unspecified place in unspecified non-institutional (private) residence as the place of occurrence of the external cause: Secondary | ICD-10-CM

## 2021-03-19 DIAGNOSIS — F0390 Unspecified dementia without behavioral disturbance: Secondary | ICD-10-CM

## 2021-03-19 DIAGNOSIS — R0902 Hypoxemia: Secondary | ICD-10-CM

## 2021-03-19 DIAGNOSIS — F0391 Unspecified dementia with behavioral disturbance: Secondary | ICD-10-CM | POA: Diagnosis not present

## 2021-03-19 DIAGNOSIS — E785 Hyperlipidemia, unspecified: Secondary | ICD-10-CM | POA: Diagnosis present

## 2021-03-19 DIAGNOSIS — Z87891 Personal history of nicotine dependence: Secondary | ICD-10-CM

## 2021-03-19 DIAGNOSIS — D72829 Elevated white blood cell count, unspecified: Secondary | ICD-10-CM | POA: Diagnosis present

## 2021-03-19 DIAGNOSIS — E872 Acidosis: Secondary | ICD-10-CM | POA: Diagnosis present

## 2021-03-19 DIAGNOSIS — J9811 Atelectasis: Secondary | ICD-10-CM | POA: Diagnosis present

## 2021-03-19 DIAGNOSIS — Z7189 Other specified counseling: Secondary | ICD-10-CM

## 2021-03-19 DIAGNOSIS — M810 Age-related osteoporosis without current pathological fracture: Secondary | ICD-10-CM | POA: Diagnosis present

## 2021-03-19 DIAGNOSIS — R6510 Systemic inflammatory response syndrome (SIRS) of non-infectious origin without acute organ dysfunction: Secondary | ICD-10-CM | POA: Diagnosis present

## 2021-03-19 DIAGNOSIS — I4510 Unspecified right bundle-branch block: Secondary | ICD-10-CM | POA: Diagnosis present

## 2021-03-19 DIAGNOSIS — Z79899 Other long term (current) drug therapy: Secondary | ICD-10-CM

## 2021-03-19 DIAGNOSIS — R451 Restlessness and agitation: Secondary | ICD-10-CM | POA: Diagnosis present

## 2021-03-19 LAB — CBC WITH DIFFERENTIAL/PLATELET
Abs Immature Granulocytes: 0.08 10*3/uL — ABNORMAL HIGH (ref 0.00–0.07)
Basophils Absolute: 0 10*3/uL (ref 0.0–0.1)
Basophils Relative: 0 %
Eosinophils Absolute: 0 10*3/uL (ref 0.0–0.5)
Eosinophils Relative: 0 %
HCT: 46.2 % — ABNORMAL HIGH (ref 36.0–46.0)
Hemoglobin: 15.1 g/dL — ABNORMAL HIGH (ref 12.0–15.0)
Immature Granulocytes: 1 %
Lymphocytes Relative: 8 %
Lymphs Abs: 1 10*3/uL (ref 0.7–4.0)
MCH: 30.9 pg (ref 26.0–34.0)
MCHC: 32.7 g/dL (ref 30.0–36.0)
MCV: 94.5 fL (ref 80.0–100.0)
Monocytes Absolute: 0.8 10*3/uL (ref 0.1–1.0)
Monocytes Relative: 6 %
Neutro Abs: 10.9 10*3/uL — ABNORMAL HIGH (ref 1.7–7.7)
Neutrophils Relative %: 85 %
Platelets: 141 10*3/uL — ABNORMAL LOW (ref 150–400)
RBC: 4.89 MIL/uL (ref 3.87–5.11)
RDW: 13.7 % (ref 11.5–15.5)
WBC: 12.8 10*3/uL — ABNORMAL HIGH (ref 4.0–10.5)
nRBC: 0 % (ref 0.0–0.2)

## 2021-03-19 LAB — BASIC METABOLIC PANEL
Anion gap: 11 (ref 5–15)
BUN: 14 mg/dL (ref 8–23)
CO2: 25 mmol/L (ref 22–32)
Calcium: 9.2 mg/dL (ref 8.9–10.3)
Chloride: 106 mmol/L (ref 98–111)
Creatinine, Ser: 0.8 mg/dL (ref 0.44–1.00)
GFR, Estimated: >60 mL/min (ref >60)
Glucose, Bld: 135 mg/dL — ABNORMAL HIGH (ref 70–99)
Potassium: 4 mmol/L (ref 3.5–5.1)
Sodium: 142 mmol/L (ref 135–145)

## 2021-03-19 LAB — RESP PANEL BY RT-PCR (FLU A&B, COVID) ARPGX2
Influenza A by PCR: NEGATIVE
Influenza B by PCR: NEGATIVE
SARS Coronavirus 2 by RT PCR: NEGATIVE

## 2021-03-19 LAB — LACTIC ACID, PLASMA
Lactic Acid, Venous: 2.3 mmol/L (ref 0.5–1.9)
Lactic Acid, Venous: 1.9 mmol/L (ref 0.5–1.9)

## 2021-03-19 LAB — HEPATIC FUNCTION PANEL
ALT: 18 U/L (ref 0–44)
AST: 25 U/L (ref 15–41)
Albumin: 4.2 g/dL (ref 3.5–5.0)
Alkaline Phosphatase: 55 U/L (ref 38–126)
Bilirubin, Direct: 0.1 mg/dL (ref 0.0–0.2)
Indirect Bilirubin: 0.7 mg/dL (ref 0.3–0.9)
Total Bilirubin: 0.8 mg/dL (ref 0.3–1.2)
Total Protein: 7.4 g/dL (ref 6.5–8.1)

## 2021-03-19 LAB — TROPONIN I (HIGH SENSITIVITY)
Troponin I (High Sensitivity): 6 ng/L (ref <18)
Troponin I (High Sensitivity): 7 ng/L (ref <18)

## 2021-03-19 LAB — PROCALCITONIN: Procalcitonin: <0.1 ng/mL

## 2021-03-19 LAB — BRAIN NATRIURETIC PEPTIDE: B Natriuretic Peptide: 114 pg/mL — ABNORMAL HIGH (ref 0.0–100.0)

## 2021-03-19 MED ORDER — ACETAMINOPHEN 650 MG RE SUPP
650.0000 mg | Freq: Four times a day (QID) | RECTAL | Status: DC | PRN
Start: 2021-03-19 — End: 2021-03-22

## 2021-03-19 MED ORDER — AMLODIPINE BESYLATE 5 MG PO TABS
2.5000 mg | ORAL_TABLET | Freq: Every day | ORAL | Status: DC
  Administered 2021-03-19 – 2021-03-22 (×3): 2.5 mg via ORAL
  Filled 2021-03-19 (×4): qty 1

## 2021-03-19 MED ORDER — HEPARIN SODIUM (PORCINE) 5000 UNIT/ML IJ SOLN
5000.0000 [IU] | Freq: Three times a day (TID) | INTRAMUSCULAR | Status: DC
  Administered 2021-03-19 – 2021-03-22 (×5): 5000 [IU] via SUBCUTANEOUS
  Filled 2021-03-19 (×9): qty 1

## 2021-03-19 MED ORDER — SODIUM CHLORIDE 0.9 % IV SOLN
1.0000 g | INTRAVENOUS | Status: DC
  Administered 2021-03-19: 1 g via INTRAVENOUS
  Filled 2021-03-19 (×2): qty 10

## 2021-03-19 MED ORDER — FUROSEMIDE 10 MG/ML IJ SOLN: 20.0000 mg | Freq: Once | INTRAMUSCULAR | Status: DC

## 2021-03-19 MED ORDER — QUETIAPINE FUMARATE 25 MG PO TABS
25.0000 mg | ORAL_TABLET | Freq: Every day | ORAL | Status: DC
  Administered 2021-03-19 – 2021-03-21 (×2): 25 mg via ORAL
  Filled 2021-03-19 (×4): qty 1

## 2021-03-19 MED ORDER — DOXYCYCLINE HYCLATE 100 MG PO TABS
100.0000 mg | ORAL_TABLET | Freq: Two times a day (BID) | ORAL | Status: DC
  Administered 2021-03-19: 100 mg via ORAL
  Filled 2021-03-19 (×2): qty 1

## 2021-03-19 MED ORDER — ACETAMINOPHEN 325 MG PO TABS: 650.0000 mg | ORAL_TABLET | Freq: Four times a day (QID) | ORAL | Status: DC | PRN

## 2021-03-19 MED ORDER — HALOPERIDOL LACTATE 5 MG/ML IJ SOLN
5.0000 mg | Freq: Once | INTRAMUSCULAR | Status: AC
  Administered 2021-03-19: 5 mg via INTRAMUSCULAR
  Filled 2021-03-19: qty 1

## 2021-03-19 MED ORDER — LEVOTHYROXINE SODIUM 50 MCG PO TABS
50.0000 ug | ORAL_TABLET | Freq: Every day | ORAL | Status: DC
  Filled 2021-03-19 (×2): qty 1

## 2021-03-19 NOTE — ED Notes (Signed)
Patient too agitated to have an EKG at this time

## 2021-03-19 NOTE — ED Notes (Signed)
Bed placement called to notify of change to med-surg bed

## 2021-03-19 NOTE — ED Notes (Signed)
Bed alarm on.

## 2021-03-19 NOTE — ED Triage Notes (Signed)
Patient presents post unwitnessed fall. She was found this morning on the floor about 1040. She complains of generalized pain.  HX Dementia

## 2021-03-19 NOTE — ED Notes (Signed)
son would like to hold off on covid test and do it last only if necessary. he said he is more worried about keeping her calm for scans.

## 2021-03-19 NOTE — ED Notes (Addendum)
Hospitalist never responded to page, paged again

## 2021-03-19 NOTE — ED Notes (Signed)
[place] placed on patient.

## 2021-03-19 NOTE — H&P (Signed)
History and Physical        Hospital Admission Note Date: 03/19/2021  Patient name: Emily Spence Medical record number: [id number] Date of birth: 1922-03-28 Age: 87 y.o. Gender: female  PCP: Biagio Borg, MD  Patient coming from: Independent living facility   Chief Complaint    Chief Complaint  Patient presents with  . Fall      HPI:   History obtained from prior notes and patient's son at bedside due to patient's advanced dementia  This is a 87 year old female with past medical history of advanced dementia, hypertension, hyperlipidemia, hypothyroidism, recurrent falls, RBBB was brought in from her independent living facility after she was found on the floor from suspected unwitnessed fall today.  Patient's son says that the patient has been living in an independent living facility for the past 20+ years with a dog and bird and says she is essentially bedbound but will occasionally ambulate to the bathroom and back to bed.  He visits her daily to and from work and she has an aide who visits regularly as well.  After the patient's son had seen her this morning the patient's aide had checked on her about 2 hours later and found her on the floor suspected due to an unknown witnessed fall.  Unable to obtain an ROS at this time due to patient's advanced dementia.   After further discussion with patient's son, he thinks that she needs a higher level of care as opposed to her independent living facility.  He understands that his mother has advanced dementia and seems to want to treat the treatable but not pursue any aggressive measures and is open to speaking with palliative care.  ED Course: Afebrile, hypertensive, intermittently hypoxic (SpO2 83-90% on room air) placed on 4 L/min however repeatedly takes off her nasal cannula. Notable Labs: BNP 114, troponin 6, WBC 12.8, Hb 15.1,  platelets 141, COVID-19 pending and otherwise labs unremarkable. Notable Imaging: CXR, CT head/cervical spine and hip XR unremarkable.In the ED, patient was agitated and received Haldol   Vitals:   03/19/21 1500 03/19/21 1800  BP: (!) 186/102 102/69  Pulse: 84 88  Resp: 19 (!) 21  Temp:    SpO2: (!) 84% 91%     Review of Systems:  ROS  Medical/Social/Family History   Past Medical History: Past Medical History:  Diagnosis Date  . Allergic rhinitis   . Anemia   . Closed 4-part fracture of proximal end of right humerus 12/01/2014  . Closed compression fracture of L1 lumbar vertebra   . Female cystocele   . HLD (hyperlipidemia)   . Hyperglycemia 09/05/2017  . Hypertension   . Hypothyroidism   . Osteoporosis   . RBBB   . Thyroid disease   . Urge urinary incontinence     Past Surgical History:  Procedure Laterality Date  . APPENDECTOMY    . CATARACT EXTRACTION W/ INTRAOCULAR LENS  IMPLANT, BILATERAL    . REVERSE SHOULDER ARTHROPLASTY Right 12/09/2014   Procedure: RIGHT REVERSE SHOULDER ARTHROPLASTY;  Surgeon: Johnny Bridge, MD;  Location: [facility];  Service: Orthopedics;  Laterality: Right;  . TONSILLECTOMY      Medications: Prior to Admission medications   Medication Sig  Start Date End Date Taking? Authorizing Provider  amLODipine (NORVASC) 2.5 MG tablet TAKE 1 TABLET BY MOUTH  DAILY Patient taking differently: Take 2.5 mg by mouth daily.  12/07/19   Biagio Borg, MD  cholecalciferol (VITAMIN D) 1000 units tablet Take 1,000 Units by mouth daily.    [provider]  donepezil (ARICEPT) 5 MG tablet TAKE 1 TABLET BY MOUTH AT  BEDTIME Patient taking differently: Take 5 mg by mouth at bedtime.  12/07/19   Biagio Borg, MD  levothyroxine (SYNTHROID) 50 MCG tablet TAKE 1 TABLET BY MOUTH  DAILY EXCEPT TAKE 1 AND 1/2 TABLETS ON TUESDAY AND  THURSDAY 08/21/20   Biagio Borg, MD    Allergies:   Allergies  Allergen Reactions  . Elemental Sulfur     unknown  .  Simvastatin     unknown    Social History:  reports that she has quit smoking. She has never used smokeless tobacco. She reports that she does not drink alcohol and does not use drugs.  Family History: Family History  Problem Relation Age of Onset  . Depression Mother      Objective   Physical Exam: Blood pressure 102/69, pulse 88, temperature 98.2 F (36.8 C), temperature source Oral, resp. rate (!) 21, SpO2 91 %.  Physical Exam Vitals and nursing note reviewed.  Constitutional:      General: She is not in acute distress.    Appearance: Normal appearance. She is not ill-appearing or toxic-appearing.  HENT:     Head: Normocephalic and atraumatic.  Eyes:     Conjunctiva/sclera: Conjunctivae normal.  Cardiovascular:     Rate and Rhythm: Normal rate and regular rhythm.  Pulmonary:     Effort: Pulmonary effort is normal.     Comments: Minimal bibasilar rales Hypoxic to mid 80s on room air.  Patient refuses to put on her nasal cannula Abdominal:     General: Abdomen is flat.     Palpations: Abdomen is soft.  Musculoskeletal:        General: No swelling or tenderness.  Skin:    Coloration: Skin is not jaundiced or pale.  Neurological:     Mental Status: She is alert. Mental status is at baseline.  Psychiatric:        Mood and Affect: Mood normal.        Behavior: Behavior normal.     LABS on Admission: I have personally reviewed all the labs and imaging below    Basic Metabolic Panel: Recent Labs  Lab 03/19/21 1316  NA 142  K 4.0  CL 106  CO2 25  GLUCOSE 135*  BUN 14  CREATININE 0.80  CALCIUM 9.2   Liver Function Tests: Recent Labs  Lab 03/19/21 1316  AST 25  ALT 18  ALKPHOS 55  BILITOT 0.8  PROT 7.4  ALBUMIN 4.2   No results for input(s): LIPASE, AMYLASE in the last 168 hours. No results for input(s): AMMONIA in the last 168 hours. CBC: Recent Labs  Lab 03/19/21 1316  WBC 12.8*  NEUTROABS 10.9*  HGB 15.1*  HCT 46.2*  MCV 94.5  PLT 141*    Cardiac Enzymes: No results for input(s): CKTOTAL, CKMB, CKMBINDEX, TROPONINI in the last 168 hours. BNP: Invalid input(s): POCBNP CBG: No results for input(s): GLUCAP in the last 168 hours.  Radiological Exams on Admission:  DG Chest 1 View  Result Date: 03/19/2021 CLINICAL DATA:  Patient found down this morning. EXAM: CHEST  1 VIEW COMPARISON:  Single-view of the chest 12/01/2014. FINDINGS: Lung volumes are low with some basilar atelectasis. No pneumothorax or pleural effusion. Heart size is normal. Aortic atherosclerosis. The patient is status post reverse right shoulder arthroplasty. No acute bony abnormality. IMPRESSION: No acute disease. Aortic Atherosclerosis (ICD10-I70.0). Electronically Signed   By: Inge Rise M.D.   On: 03/19/2021 14:20   CT Head Wo Contrast  Result Date: 03/19/2021 CLINICAL DATA:  Dementia.  Fall.  Trauma to the head and neck. EXAM: CT HEAD WITHOUT CONTRAST CT CERVICAL SPINE WITHOUT CONTRAST TECHNIQUE: Multidetector CT imaging of the head and cervical spine was performed following the standard protocol without intravenous contrast. Multiplanar CT image reconstructions of the cervical spine were also generated. COMPARISON:  None. FINDINGS: CT HEAD FINDINGS Brain: The study suffers from some motion degradation. There is generalized atrophy. There chronic small-vessel ischemic changes of the white matter. No sign of acute infarction, mass lesion, hemorrhage, hydrocephalus or extra-axial collection. Vascular: There is atherosclerotic calcification of the major vessels at the base of the brain. Skull: Negative Sinuses/Orbits: Clear/normal Other: None CT CERVICAL SPINE FINDINGS Alignment: No traumatic malalignment. Skull base and vertebrae: No regional fracture or primary bone lesion. Soft tissues and spinal canal: Negative Disc levels: Chronic degenerative spondylosis from C3-4 through C6-7. This is most pronounced at C4-5 and C5-6 where there is disc space narrowing  and endplate osteophytes with foraminal narrowing right worse than left at C4-5 and bilateral at C5-6. there is facet degenerative disease on the left at C7-T1. Upper chest: Negative Other: None IMPRESSION: Head CT: No acute or traumatic finding. Atrophy and chronic small-vessel ischemic changes of the white matter. Cervical spine CT: No acute or traumatic finding. Ordinary cervical spondylosis. Electronically Signed   By: Nelson Chimes M.D.   On: 03/19/2021 14:15   CT Cervical Spine Wo Contrast  Result Date: 03/19/2021 CLINICAL DATA:  Dementia.  Fall.  Trauma to the head and neck. EXAM: CT HEAD WITHOUT CONTRAST CT CERVICAL SPINE WITHOUT CONTRAST TECHNIQUE: Multidetector CT imaging of the head and cervical spine was performed following the standard protocol without intravenous contrast. Multiplanar CT image reconstructions of the cervical spine were also generated. COMPARISON:  None. FINDINGS: CT HEAD FINDINGS Brain: The study suffers from some motion degradation. There is generalized atrophy. There chronic small-vessel ischemic changes of the white matter. No sign of acute infarction, mass lesion, hemorrhage, hydrocephalus or extra-axial collection. Vascular: There is atherosclerotic calcification of the major vessels at the base of the brain. Skull: Negative Sinuses/Orbits: Clear/normal Other: None CT CERVICAL SPINE FINDINGS Alignment: No traumatic malalignment. Skull base and vertebrae: No regional fracture or primary bone lesion. Soft tissues and spinal canal: Negative Disc levels: Chronic degenerative spondylosis from C3-4 through C6-7. This is most pronounced at C4-5 and C5-6 where there is disc space narrowing and endplate osteophytes with foraminal narrowing right worse than left at C4-5 and bilateral at C5-6. there is facet degenerative disease on the left at C7-T1. Upper chest: Negative Other: None IMPRESSION: Head CT: No acute or traumatic finding. Atrophy and chronic small-vessel ischemic changes of  the white matter. Cervical spine CT: No acute or traumatic finding. Ordinary cervical spondylosis. Electronically Signed   By: Nelson Chimes M.D.   On: 03/19/2021 14:15   DG Hip Unilat With Pelvis 2-3 Views Right  Result Date: 03/19/2021 CLINICAL DATA:  Fall, unwitnessed fall generalized pain. EXAM: DG HIP (WITH OR WITHOUT PELVIS) 2-3V RIGHT COMPARISON:  April 26, 2020. FINDINGS: No signs of fracture of the bony pelvis. Soft  tissues are unremarkable. RIGHT hip is located without visible fracture. Osteopenia and spinal degenerative changes. IMPRESSION: 1. No fracture or dislocation of the RIGHT hip. 2. Osteopenia and spinal degenerative changes. Electronically Signed   By: Zetta Bills M.D.   On: 03/19/2021 14:24      EKG: unchanged from previous tracings, RBBB   A & P   Principal Problem:   Acute respiratory failure with hypoxia (HCC) Active Problems:   Hypertension   Hypothyroidism   HLD (hyperlipidemia)   Dementia ([place])   Fall at home, initial encounter   1. Acute hypoxic respiratory failure, most likely from atelectasis vs CAP vs less likely volume overload a. SpO2 consistently in mid 80s but patient is alert and comfortable and removing her nasal canula b. CXR unremarkable c. Given leukocytosis, lactic acidosis and hypoxia, will empirically start CAP therapy with Ceftriaxone and Doxycycline and would discontinue antibiotics if procalcitonin is negative d. Will try an incentive spirometer though I doubt she will tolerate it  e. encourage nasal canula use f. Unlikely a PE and if it were she would be a poor candidate for anticoagulation given her age and recurrent falls.   This was discussed with the patient's son who agrees with this plan. Discontinue VQ scan. g. She is likely a hospice candidate. Will consult palliative care  2. Unwitnessed fall a. No fractures noted on imaging b. PT eval, she will likely need SNF at discharge  3. Advanced dementia a. Palliative care  consult b.   4. SIRS less likely sepsis  a. Technically meets sepsis criteria: Tachypnea, leukocytosis and lactic acidosis, however clinically not septic b. Check a UA c. Antibiotics as above  5. Hypertension a. Continue home amlodipine  6. Hypothyroidism a. Levothyroxine   DVT prophylaxis: Heparin   Code Status: DNR  Diet: Regular Family Communication: Admission, patients condition and plan of care including tests being ordered have been discussed with the patient who indicates understanding and agrees with the plan and Code Status. Patient's son was updated  Disposition Plan: The appropriate patient status for this patient is OBSERVATION. Observation status is judged to be reasonable and necessary in order to provide the required intensity of service to ensure the patient's safety. The patient's presenting symptoms, physical exam findings, and initial radiographic and laboratory data in the context of their medical condition is felt to place them at decreased risk for further clinical deterioration. Furthermore, it is anticipated that the patient will be medically stable for discharge from the hospital within 2 midnights of admission. The following factors support the patient status of observation.   " The patient's presenting symptoms include fall " The physical exam findings include hypoxia, rales. " The initial radiographic and laboratory data are leukocytosis, minimally elevated BNP.    Consultants  . palliative  Procedures  . none  Time Spent on Admission: 60 minutes    Harold Hedge, DO [facility]  03/19/2021, 6:22 PM

## 2021-03-19 NOTE — ED Notes (Signed)
Pt screaming out "Emily Spence come here. Are you trying to kill me"

## 2021-03-19 NOTE — ED Notes (Signed)
Hospitalist paged again.

## 2021-03-19 NOTE — ED Notes (Signed)
Patient sats will drop into the 80's. Patient will not keep O2 in place.

## 2021-03-19 NOTE — ED Provider Notes (Signed)
[facility] DEPT Provider Note   CSN: [id number] Arrival date & time: 03/19/21  1200     History Chief Complaint  Patient presents with  . Fall    Emily Spence is a 87 y.o. female.  Level 5 caveat due to dementia history.  Unwitnessed fall at independent living facility.  Patient unable to answer questions correctly.  Difficult to obtain history.  EMS states that they were called out for unwitnessed fall.  Someone had seen her maybe 2 hours prior.   Fall This is a new problem.       Past Medical History:  Diagnosis Date  . Allergic rhinitis   . Anemia   . Closed 4-part fracture of proximal end of right humerus 12/01/2014  . Closed compression fracture of L1 lumbar vertebra   . Female cystocele   . HLD (hyperlipidemia)   . Hyperglycemia 09/05/2017  . Hypertension   . Hypothyroidism   . Osteoporosis   . RBBB   . Thyroid disease   . Urge urinary incontinence     Patient Active Problem List   Diagnosis Date Noted  . Dysuria 10/28/2019  . Memory loss 06/05/2018  . HLD (hyperlipidemia)   . Urge urinary incontinence   . RBBB   . Osteoporosis   . Female cystocele   . Closed compression fracture of L1 lumbar vertebra   . Hyperglycemia 09/05/2017  . Preventative health care 09/05/2017  . Abdominal mass 09/05/2017  . Anemia 09/05/2017  . Hypertension   . Hypothyroidism   . Allergic rhinitis   . Fracture of humerus, proximal, right, closed 12/09/2014  . Closed 4-part fracture of proximal end of right humerus 12/01/2014    Past Surgical History:  Procedure Laterality Date  . APPENDECTOMY    . CATARACT EXTRACTION W/ INTRAOCULAR LENS  IMPLANT, BILATERAL    . REVERSE SHOULDER ARTHROPLASTY Right 12/09/2014   Procedure: RIGHT REVERSE SHOULDER ARTHROPLASTY;  Surgeon: Johnny Bridge, MD;  Location: [facility];  Service: Orthopedics;  Laterality: Right;  . TONSILLECTOMY       OB History   No obstetric history on file.     Family  History  Problem Relation Age of Onset  . Depression Mother     Social History   Tobacco Use  . Smoking status: Former Research scientist (life sciences)  . Smokeless tobacco: Never Used  Substance Use Topics  . Alcohol use: No  . Drug use: No    Home Medications Prior to Admission medications   Medication Sig Start Date End Date Taking? Authorizing Provider  amLODipine (NORVASC) 2.5 MG tablet TAKE 1 TABLET BY MOUTH  DAILY Patient taking differently: Take 2.5 mg by mouth daily.  12/07/19   Biagio Borg, MD  cholecalciferol (VITAMIN D) 1000 units tablet Take 1,000 Units by mouth daily.    [provider]  donepezil (ARICEPT) 5 MG tablet TAKE 1 TABLET BY MOUTH AT  BEDTIME Patient taking differently: Take 5 mg by mouth at bedtime.  12/07/19   Biagio Borg, MD  levothyroxine (SYNTHROID) 50 MCG tablet TAKE 1 TABLET BY MOUTH  DAILY EXCEPT TAKE 1 AND 1/2 TABLETS ON TUESDAY AND  THURSDAY 08/21/20   Biagio Borg, MD    Allergies    Elemental sulfur and Simvastatin  Review of Systems   Review of Systems  Unable to perform ROS: Dementia    Physical Exam Updated Vital Signs  ED Triage Vitals [03/19/21 1228]  Enc Vitals Group     BP Marland Kitchen)  150/91     Pulse Rate 86     Resp (!) 22     Temp 98.2 F (36.8 C)     Temp Source Oral     SpO2 90 %     Weight      Height      Head Circumference      Peak Flow      Pain Score      Pain Loc      Pain Edu?      Excl. in GC?     Physical Exam Vitals and nursing note reviewed.  Constitutional:      General: She is not in acute distress.    Appearance: She is well-developed. She is not ill-appearing.  HENT:     Head: Normocephalic and atraumatic.     Nose: Nose normal.  Eyes:     Extraocular Movements: Extraocular movements intact.     Conjunctiva/sclera: Conjunctivae normal.     Pupils: Pupils are equal, round, and reactive to light.  Cardiovascular:     Rate and Rhythm: Normal rate and regular rhythm.     Pulses: Normal pulses.     Heart  sounds: Normal heart sounds. No murmur heard.   Pulmonary:     Effort: Pulmonary effort is normal. No respiratory distress.     Breath sounds: Normal breath sounds.  Abdominal:     Palpations: Abdomen is soft.     Tenderness: There is no abdominal tenderness.  Musculoskeletal:        General: No tenderness.     Cervical back: Normal range of motion and neck supple.     Comments: No obvious tenderness on exam or deformities  Skin:    General: Skin is warm and dry.     Capillary Refill: Capillary refill takes less than 2 seconds.  Neurological:     General: No focal deficit present.     Mental Status: She is alert.     ED Results / Procedures / Treatments   Labs (all labs ordered are listed, but only abnormal results are displayed) Labs Reviewed  CBC WITH DIFFERENTIAL/PLATELET - Abnormal; Notable for the following components:      Result Value   WBC 12.8 (*)    Hemoglobin 15.1 (*)    HCT 46.2 (*)    Platelets 141 (*)    Neutro Abs 10.9 (*)    Abs Immature Granulocytes 0.08 (*)    All other components within normal limits  BASIC METABOLIC PANEL - Abnormal; Notable for the following components:   Glucose, Bld 135 (*)    All other components within normal limits  LACTIC ACID, PLASMA - Abnormal; Notable for the following components:   Lactic Acid, Venous 2.3 (*)    All other components within normal limits  BRAIN NATRIURETIC PEPTIDE - Abnormal; Notable for the following components:   B Natriuretic Peptide 114.0 (*)    All other components within normal limits  RESP PANEL BY RT-PCR (FLU A&B, COVID) ARPGX2  CULTURE, BLOOD (SINGLE)  HEPATIC FUNCTION PANEL  LACTIC ACID, PLASMA  TROPONIN I (HIGH SENSITIVITY)  TROPONIN I (HIGH SENSITIVITY)    EKG EKG Interpretation  Date/Time:  Monday Mar 19 2021 14:17:38 EDT Ventricular Rate:  102 PR Interval:  148 QRS Duration: 130 QT Interval:  350 QTC Calculation: 454 R Axis:   30 Text Interpretation: Sinus tachycardia Right  bundle branch block Artifact Confirmed by Virgina Norfolkuratolo, Zanyiah Posten ([id number]) on 03/19/2021 3:12:33 PM   Radiology DG Chest 1  View  Result Date: 03/19/2021 CLINICAL DATA:  Patient found down this morning. EXAM: CHEST  1 VIEW COMPARISON:  Single-view of the chest 12/01/2014. FINDINGS: Lung volumes are low with some basilar atelectasis. No pneumothorax or pleural effusion. Heart size is normal. Aortic atherosclerosis. The patient is status post reverse right shoulder arthroplasty. No acute bony abnormality. IMPRESSION: No acute disease. Aortic Atherosclerosis (ICD10-I70.0). Electronically Signed   By: Inge Rise M.D.   On: 03/19/2021 14:20   CT Head Wo Contrast  Result Date: 03/19/2021 CLINICAL DATA:  Dementia.  Fall.  Trauma to the head and neck. EXAM: CT HEAD WITHOUT CONTRAST CT CERVICAL SPINE WITHOUT CONTRAST TECHNIQUE: Multidetector CT imaging of the head and cervical spine was performed following the standard protocol without intravenous contrast. Multiplanar CT image reconstructions of the cervical spine were also generated. COMPARISON:  None. FINDINGS: CT HEAD FINDINGS Brain: The study suffers from some motion degradation. There is generalized atrophy. There chronic small-vessel ischemic changes of the white matter. No sign of acute infarction, mass lesion, hemorrhage, hydrocephalus or extra-axial collection. Vascular: There is atherosclerotic calcification of the major vessels at the base of the brain. Skull: Negative Sinuses/Orbits: Clear/normal Other: None CT CERVICAL SPINE FINDINGS Alignment: No traumatic malalignment. Skull base and vertebrae: No regional fracture or primary bone lesion. Soft tissues and spinal canal: Negative Disc levels: Chronic degenerative spondylosis from C3-4 through C6-7. This is most pronounced at C4-5 and C5-6 where there is disc space narrowing and endplate osteophytes with foraminal narrowing right worse than left at C4-5 and bilateral at C5-6. there is facet degenerative  disease on the left at C7-T1. Upper chest: Negative Other: None IMPRESSION: Head CT: No acute or traumatic finding. Atrophy and chronic small-vessel ischemic changes of the white matter. Cervical spine CT: No acute or traumatic finding. Ordinary cervical spondylosis. Electronically Signed   By: Nelson Chimes M.D.   On: 03/19/2021 14:15   CT Cervical Spine Wo Contrast  Result Date: 03/19/2021 CLINICAL DATA:  Dementia.  Fall.  Trauma to the head and neck. EXAM: CT HEAD WITHOUT CONTRAST CT CERVICAL SPINE WITHOUT CONTRAST TECHNIQUE: Multidetector CT imaging of the head and cervical spine was performed following the standard protocol without intravenous contrast. Multiplanar CT image reconstructions of the cervical spine were also generated. COMPARISON:  None. FINDINGS: CT HEAD FINDINGS Brain: The study suffers from some motion degradation. There is generalized atrophy. There chronic small-vessel ischemic changes of the white matter. No sign of acute infarction, mass lesion, hemorrhage, hydrocephalus or extra-axial collection. Vascular: There is atherosclerotic calcification of the major vessels at the base of the brain. Skull: Negative Sinuses/Orbits: Clear/normal Other: None CT CERVICAL SPINE FINDINGS Alignment: No traumatic malalignment. Skull base and vertebrae: No regional fracture or primary bone lesion. Soft tissues and spinal canal: Negative Disc levels: Chronic degenerative spondylosis from C3-4 through C6-7. This is most pronounced at C4-5 and C5-6 where there is disc space narrowing and endplate osteophytes with foraminal narrowing right worse than left at C4-5 and bilateral at C5-6. there is facet degenerative disease on the left at C7-T1. Upper chest: Negative Other: None IMPRESSION: Head CT: No acute or traumatic finding. Atrophy and chronic small-vessel ischemic changes of the white matter. Cervical spine CT: No acute or traumatic finding. Ordinary cervical spondylosis. Electronically Signed   By: Nelson Chimes M.D.   On: 03/19/2021 14:15   DG Hip Unilat With Pelvis 2-3 Views Right  Result Date: 03/19/2021 CLINICAL DATA:  Fall, unwitnessed fall generalized pain. EXAM: DG HIP (WITH  OR WITHOUT PELVIS) 2-3V RIGHT COMPARISON:  April 26, 2020. FINDINGS: No signs of fracture of the bony pelvis. Soft tissues are unremarkable. RIGHT hip is located without visible fracture. Osteopenia and spinal degenerative changes. IMPRESSION: 1. No fracture or dislocation of the RIGHT hip. 2. Osteopenia and spinal degenerative changes. Electronically Signed   By: Zetta Bills M.D.   On: 03/19/2021 14:24    Procedures Procedures   Medications Ordered in ED Medications  haloperidol lactate (HALDOL) injection 5 mg (5 mg Intramuscular Given 03/19/21 1258)    ED Course  I have reviewed the triage vital signs and the nursing notes.  Pertinent labs & imaging results that were available during my care of the patient were reviewed by me and considered in my medical decision making (see chart for details).    MDM Rules/Calculators/A&P                          Emily Spence is a 87 year old female with history of dementia who presents the ED after unwitnessed fall.  Patient with normal vitals but appears to have some intermittent hypoxia.  She has had several sustained hypoxic events into the low 80s with good waveform.  Patient with severe dementia and agitated with questioning and exam.  Does not appear to have any tenderness on exam and no evidence of any trauma.  Does not appear to be in respiratory distress.  Not quite sure what to make of low oxygen but will try to place her on 2 L but patient continues to rip out nasal cannula.  We will give a dose of Haldol.  Supposedly she is a DNR per EMS.  We will try to touch base with patient's son.  Will get CT scan of head and neck.  Will get chest x-ray and pursue work-up for reasons for hypoxia.  Will test for COVID evaluate for infectious process.  Does not appear to  have any signs of volume overload.  No history of major cardiac or pulmonary disease per chart review.  Chest x-ray is unremarkable.  No significant anemia, electrolyte abnormality, kidney injury.  Head CT and neck CT unremarkable.  Troponin normal.  BMP fairly unremarkable.  Patient continues to be hypoxic in the mid 80s to upper 80s.  Not quite sure why.  She is stable on 2 L of oxygen.  We will order a VQ scan and have her admitted to medicine for further work-up given hypoxia.  May need an echocardiogram.  She is not having any shortness of breath or chest pain.  No smoking history.   This chart was dictated using voice recognition software.  Despite best efforts to proofread,  errors can occur which can change the documentation meaning.    Final Clinical Impression(s) / ED Diagnoses Final diagnoses:  Fall, initial encounter  Hypoxia    Rx / DC Orders ED Discharge Orders    None       Lennice Sites, DO 03/19/21 1514

## 2021-03-19 NOTE — ED Notes (Signed)
Patient is off the floor for scan

## 2021-03-19 NOTE — ED Notes (Signed)
Patient's son at bedside.

## 2021-03-19 NOTE — ED Notes (Signed)
Phone order with readback from Dr. Tonie Griffith to switch pt from telemetry bed to med-surg bed

## 2021-03-19 NOTE — ED Notes (Signed)
Patient was wet.  Changed sheets, gown, blankets Placed Brief, chucks pad on patient

## 2021-03-19 NOTE — ED Notes (Signed)
Hospitalist paged to see if pt can be switched from telemetry floor to med-surg

## 2021-03-20 DIAGNOSIS — R6510 Systemic inflammatory response syndrome (SIRS) of non-infectious origin without acute organ dysfunction: Secondary | ICD-10-CM | POA: Diagnosis present

## 2021-03-20 DIAGNOSIS — Z20822 Contact with and (suspected) exposure to covid-19: Secondary | ICD-10-CM | POA: Diagnosis present

## 2021-03-20 DIAGNOSIS — F0391 Unspecified dementia with behavioral disturbance: Secondary | ICD-10-CM | POA: Diagnosis present

## 2021-03-20 DIAGNOSIS — Z515 Encounter for palliative care: Secondary | ICD-10-CM

## 2021-03-20 DIAGNOSIS — W19XXXA Unspecified fall, initial encounter: Secondary | ICD-10-CM | POA: Diagnosis present

## 2021-03-20 DIAGNOSIS — R4182 Altered mental status, unspecified: Secondary | ICD-10-CM | POA: Diagnosis present

## 2021-03-20 DIAGNOSIS — M810 Age-related osteoporosis without current pathological fracture: Secondary | ICD-10-CM | POA: Diagnosis present

## 2021-03-20 DIAGNOSIS — J9811 Atelectasis: Secondary | ICD-10-CM | POA: Diagnosis present

## 2021-03-20 DIAGNOSIS — E872 Acidosis: Secondary | ICD-10-CM | POA: Diagnosis present

## 2021-03-20 DIAGNOSIS — I10 Essential (primary) hypertension: Secondary | ICD-10-CM | POA: Diagnosis present

## 2021-03-20 DIAGNOSIS — D72829 Elevated white blood cell count, unspecified: Secondary | ICD-10-CM | POA: Diagnosis present

## 2021-03-20 DIAGNOSIS — Z87891 Personal history of nicotine dependence: Secondary | ICD-10-CM | POA: Diagnosis not present

## 2021-03-20 DIAGNOSIS — Z7189 Other specified counseling: Secondary | ICD-10-CM

## 2021-03-20 DIAGNOSIS — I4510 Unspecified right bundle-branch block: Secondary | ICD-10-CM | POA: Diagnosis present

## 2021-03-20 DIAGNOSIS — R296 Repeated falls: Secondary | ICD-10-CM | POA: Diagnosis present

## 2021-03-20 DIAGNOSIS — J9601 Acute respiratory failure with hypoxia: Secondary | ICD-10-CM | POA: Diagnosis present

## 2021-03-20 DIAGNOSIS — E039 Hypothyroidism, unspecified: Secondary | ICD-10-CM | POA: Diagnosis present

## 2021-03-20 DIAGNOSIS — Y92009 Unspecified place in unspecified non-institutional (private) residence as the place of occurrence of the external cause: Secondary | ICD-10-CM | POA: Diagnosis not present

## 2021-03-20 DIAGNOSIS — F0390 Unspecified dementia without behavioral disturbance: Secondary | ICD-10-CM | POA: Diagnosis not present

## 2021-03-20 DIAGNOSIS — Z96611 Presence of right artificial shoulder joint: Secondary | ICD-10-CM | POA: Diagnosis present

## 2021-03-20 DIAGNOSIS — R451 Restlessness and agitation: Secondary | ICD-10-CM | POA: Diagnosis present

## 2021-03-20 DIAGNOSIS — Z79899 Other long term (current) drug therapy: Secondary | ICD-10-CM | POA: Diagnosis not present

## 2021-03-20 DIAGNOSIS — Z66 Do not resuscitate: Secondary | ICD-10-CM | POA: Diagnosis present

## 2021-03-20 LAB — BASIC METABOLIC PANEL
Anion gap: 8 (ref 5–15)
BUN: 16 mg/dL (ref 8–23)
CO2: 27 mmol/L (ref 22–32)
Calcium: 9 mg/dL (ref 8.9–10.3)
Chloride: 105 mmol/L (ref 98–111)
Creatinine, Ser: 0.98 mg/dL (ref 0.44–1.00)
GFR, Estimated: 53 mL/min — ABNORMAL LOW (ref >60)
Glucose, Bld: 141 mg/dL — ABNORMAL HIGH (ref 70–99)
Potassium: 4.5 mmol/L (ref 3.5–5.1)
Sodium: 140 mmol/L (ref 135–145)

## 2021-03-20 LAB — CBC
HCT: 46.5 % — ABNORMAL HIGH (ref 36.0–46.0)
Hemoglobin: 15.1 g/dL — ABNORMAL HIGH (ref 12.0–15.0)
MCH: 30.8 pg (ref 26.0–34.0)
MCHC: 32.5 g/dL (ref 30.0–36.0)
MCV: 94.7 fL (ref 80.0–100.0)
Platelets: 131 10*3/uL — ABNORMAL LOW (ref 150–400)
RBC: 4.91 MIL/uL (ref 3.87–5.11)
RDW: 14 % (ref 11.5–15.5)
WBC: 9.7 10*3/uL (ref 4.0–10.5)
nRBC: 0 % (ref 0.0–0.2)

## 2021-03-20 MED ORDER — THIAMINE HCL 100 MG/ML IJ SOLN
100.0000 mg | Freq: Every day | INTRAMUSCULAR | Status: DC
  Administered 2021-03-20 – 2021-03-22 (×3): 100 mg via INTRAVENOUS
  Filled 2021-03-20 (×3): qty 2

## 2021-03-20 MED ORDER — DEXTROSE IN LACTATED RINGERS 5 % IV SOLN
INTRAVENOUS | Status: DC
  Filled 2021-03-20: qty 1000

## 2021-03-20 NOTE — Consult Note (Signed)
Consultation Note Date: 03/20/2021   Patient Name: Emily Spence  DOB: [birth year]  MRN: [id number]  Age / Sex: 87 y.o., female  PCP: Biagio Borg, MD Referring Physician: Elodia Florence., *  Reason for Consultation: Establishing goals of care  HPI/Patient Profile: 87 y.o. female  with past medical history of advanced dementia, hypertension, hyperlipidemia, hypothyroidism, recurrent falls admitted on 03/19/2021 with unwitnessed fall with no fractures or injury on scans.   Clinical Assessment and Goals of Care: I met today at Emily Spence's bedside with her son, [name]. Emily Spence slept throughout my visit. [name] explains to me his mother's progressive decline over the years. She is mostly bed bound, incontinent most of the time, intake decreasing, and she no longer recognizes him. He shares that she moved into independent living ~21 years ago. He checks on her to and from his work and has aides coming in to assist during day. Unfortunately she has progressed to the point of needing a higher level of care. [name] is in contact with [facility] (she lives in [facility]) for placement. We discussed goals for Emily Spence and although [name] agrees with treatment of reversible infections/conditions he is aware of her decline and that her time here is limited. He notes that she has been "ready" for a long time now. We discussed the addition of hospice to help with her care and he plans to discuss with his sister but is open to hospice. We did not discuss hospice facility as we will see how she does with intake and placement options. I will follow up to discuss further tomorrow.   All questions/concerns addressed. Emotional support provided.   Primary Decision Maker NEXT OF KIN son [name]    SUMMARY OF RECOMMENDATIONS   - DNR - Pursue higher level of care with hospice added to care  Code Status/Advance Care  Planning:  DNR   Symptom Management:   Per attending  Palliative Prophylaxis:   Aspiration, Bowel Regimen, Delirium Protocol, Frequent Pain Assessment, Oral Care and Turn Reposition  Prognosis:   < 6 months likely with progressing dementia  Discharge Planning: [facility] with Hospice      Primary Diagnoses: Present on Admission: . Acute respiratory failure with hypoxia ([place]) . HLD (hyperlipidemia) . Hypertension . Hypothyroidism   I have reviewed the medical record, interviewed the patient and family, and examined the patient. The following aspects are pertinent.  Past Medical History:  Diagnosis Date  . Allergic rhinitis   . Anemia   . Closed 4-part fracture of proximal end of right humerus 12/01/2014  . Closed compression fracture of L1 lumbar vertebra   . Female cystocele   . HLD (hyperlipidemia)   . Hyperglycemia 09/05/2017  . Hypertension   . Hypothyroidism   . Osteoporosis   . RBBB   . Thyroid disease   . Urge urinary incontinence    Social History   Socioeconomic History  . Marital status: Widowed    Spouse name: Not on file  . Number of  children: Not on file  . Years of education: Not on file  . Highest education level: Not on file  Occupational History  . Not on file  Tobacco Use  . Smoking status: Former Research scientist (life sciences)  . Smokeless tobacco: Never Used  Substance and Sexual Activity  . Alcohol use: No  . Drug use: No  . Sexual activity: Not on file  Other Topics Concern  . Not on file  Social History Narrative  . Not on file   Social Determinants of Health   Financial Resource Strain: Not on file  Food Insecurity: Not on file  Transportation Needs: Not on file  Physical Activity: Not on file  Stress: Not on file  Social Connections: Not on file   Family History  Problem Relation Age of Onset  . Depression Mother    Scheduled Meds: . amLODipine  2.5 mg Oral Daily  . doxycycline  100 mg Oral Q12H  . heparin  5,000 Units  Subcutaneous Q8H  . levothyroxine  50 mcg Oral Q0600  . QUEtiapine  25 mg Oral QHS   Continuous Infusions: . cefTRIAXone (ROCEPHIN)  IV Stopped (03/19/21 1917)   PRN Meds:.acetaminophen **OR** acetaminophen Allergies  Allergen Reactions  . Elemental Sulfur     unknown  . Simvastatin     unknown   Review of Systems  Unable to perform ROS: Dementia    Physical Exam Vitals and nursing note reviewed.  Constitutional:      General: She is sleeping. She is not in acute distress.    Appearance: She is ill-appearing.     Comments: Frail, elderly  Cardiovascular:     Rate and Rhythm: Normal rate.  Pulmonary:     Effort: No tachypnea, accessory muscle usage or respiratory distress.  Abdominal:     General: Abdomen is flat.     Vital Signs: BP (!) 142/67 (BP Location: Right Arm)   Pulse 88   Temp 98.4 F (36.9 C) (Oral)   Resp 16   SpO2 91%  Pain Scale: Faces   Pain Score: 0-No pain   SpO2: SpO2: 91 % O2 Device:SpO2: 91 % O2 Flow Rate: .O2 Flow Rate (L/min): 4 L/min  IO: Intake/output summary:   Intake/Output Summary (Last 24 hours) at 03/20/2021 0954 Last data filed at 03/20/2021 8850 Gross per 24 hour  Intake 100 ml  Output 0 ml  Net 100 ml    LBM: Last BM Date:  (patient demented) Baseline Weight:   Most recent weight:       Palliative Assessment/Data:     Time In: 0945 Time Out: 1015 Time Total: 30 min Greater than 50%  of this time was spent counseling and coordinating care related to the above assessment and plan.  Signed by: Vinie Sill, NP Palliative Medicine Team Pager # [phone] (M-F 8a-5p) Team Phone # [phone] (Nights/Weekends)

## 2021-03-20 NOTE — Plan of Care (Signed)
Plan of care discussed with pts son.

## 2021-03-20 NOTE — ED Notes (Signed)
ED TO INPATIENT HANDOFF REPORT  Name/Age/Gender Emily Spence 87 y.o. female  Code Status    Code Status Orders  (From admission, onward)         Start     Ordered   03/19/21 1542  Do not attempt resuscitation (DNR)  Continuous       Question Answer Comment  In the event of cardiac or respiratory ARREST Do not call a "code blue"   In the event of cardiac or respiratory ARREST Do not perform Intubation, CPR, defibrillation or ACLS   In the event of cardiac or respiratory ARREST Use medication by any route, position, wound care, and other measures to relive pain and suffering. May use oxygen, suction and manual treatment of airway obstruction as needed for comfort.      03/19/21 1541        Code Status History    Date Active Date Inactive Code Status Order ID Comments User Context   12/09/2014 1414 12/12/2014 1842 Full Code [id number]  Johnny Bridge, MD Inpatient   Advance Care Planning Activity    Advance Directive Documentation   Flowsheet Row Most Recent Value  Type of Advance Directive Out of facility DNR (pink MOST or yellow form), Healthcare Power of [place], Living will  [son states dnr is at home on refrigerator]  Pre-existing out of facility DNR order (yellow form or pink MOST form) --  "MOST" Form in Place? --      Home/SNF/Other Home  Chief Complaint Acute respiratory failure with hypoxia ([place]) [J96.01]  Level of Care/Admitting Diagnosis ED Disposition    ED Disposition Condition [facility]: [place] [[id number]]  Level of Care: Med-Surg [16]  Covid Evaluation: Asymptomatic Screening Protocol (No Symptoms)  Diagnosis: Acute respiratory failure with hypoxia [facility]) [[id number]]  Admitting Physician: Eben Burow [[id number]]  Attending Physician: Eben Burow [[id number]]       Medical History Past Medical History:  Diagnosis Date  . Allergic rhinitis   . Anemia   . Closed 4-part fracture of proximal  end of right humerus 12/01/2014  . Closed compression fracture of L1 lumbar vertebra   . Female cystocele   . HLD (hyperlipidemia)   . Hyperglycemia 09/05/2017  . Hypertension   . Hypothyroidism   . Osteoporosis   . RBBB   . Thyroid disease   . Urge urinary incontinence     Allergies Allergies  Allergen Reactions  . Elemental Sulfur     unknown  . Simvastatin     unknown    IV Location/Drains/Wounds Patient Lines/Drains/Airways Status    Active Line/Drains/Airways    Name Placement date Placement time Site Days   Peripheral IV 03/19/21 Left Forearm 03/19/21  1316  Forearm  1   Incision (Closed) 12/09/14 Shoulder Right 12/09/14  1030  -- 2293          Labs/Imaging Results for orders placed or performed during the hospital encounter of 03/19/21 (from the past 48 hour(s))  CBC with Differential     Status: Abnormal   Collection Time: 03/19/21  1:16 PM  Result Value Ref Range   WBC 12.8 (H) 4.0 - 10.5 K/uL   RBC 4.89 3.87 - 5.11 MIL/uL   Hemoglobin 15.1 (H) 12.0 - 15.0 g/dL   HCT 46.2 (H) 36.0 - 46.0 %   MCV 94.5 80.0 - 100.0 fL   MCH 30.9 26.0 - 34.0 pg   MCHC 32.7 30.0 - 36.0 g/dL  RDW 13.7 11.5 - 15.5 %   Platelets 141 (L) 150 - 400 K/uL   nRBC 0.0 0.0 - 0.2 %   Neutrophils Relative % 85 %   Neutro Abs 10.9 (H) 1.7 - 7.7 K/uL   Lymphocytes Relative 8 %   Lymphs Abs 1.0 0.7 - 4.0 K/uL   Monocytes Relative 6 %   Monocytes Absolute 0.8 0.1 - 1.0 K/uL   Eosinophils Relative 0 %   Eosinophils Absolute 0.0 0.0 - 0.5 K/uL   Basophils Relative 0 %   Basophils Absolute 0.0 0.0 - 0.1 K/uL   Immature Granulocytes 1 %   Abs Immature Granulocytes 0.08 (H) 0.00 - 0.07 K/uL    Comment: Performed at [facility], [place] [address], [place], [place] [address]  Basic metabolic panel     Status: Abnormal   Collection Time: 03/19/21  1:16 PM  Result Value Ref Range   Sodium 142 135 - 145 mmol/L   Potassium 4.0 3.5 - 5.1 mmol/L   Chloride 106 98 - 111 mmol/L    CO2 25 22 - 32 mmol/L   Glucose, Bld 135 (H) 70 - 99 mg/dL    Comment: Glucose reference range applies only to samples taken after fasting for at least 8 hours.   BUN 14 8 - 23 mg/dL   Creatinine, Ser 0.80 0.44 - 1.00 mg/dL   Calcium 9.2 8.9 - 10.3 mg/dL   GFR, Estimated >60 >60 mL/min    Comment: (NOTE) Calculated using the CKD-EPI Creatinine Equation (2021)    Anion gap 11 5 - 15    Comment: Performed at [facility], [place] [address] [place], [place] [address]  Resp Panel by RT-PCR (Flu A&B, Covid) Nasopharyngeal Swab     Status: None   Collection Time: 03/19/21  1:16 PM   Specimen: Nasopharyngeal Swab; Nasopharyngeal(NP) swabs in vial transport medium  Result Value Ref Range   SARS Coronavirus 2 by RT PCR NEGATIVE NEGATIVE    Comment: (NOTE) SARS-CoV-2 target nucleic acids are NOT DETECTED.  The SARS-CoV-2 RNA is generally detectable in upper respiratory specimens during the acute phase of infection. The lowest concentration of SARS-CoV-2 viral copies this assay can detect is 138 copies/mL. A negative result does not preclude SARS-Cov-2 infection and should not be used as the sole basis for treatment or other patient management decisions. A negative result may occur with  improper specimen collection/handling, submission of specimen other than nasopharyngeal swab, presence of viral mutation(s) within the areas targeted by this assay, and inadequate number of viral copies(<138 copies/mL). A negative result must be combined with clinical observations, patient history, and epidemiological information. The expected result is Negative.  Fact Sheet for Patients:  EntrepreneurPulse.com.au  Fact Sheet for Healthcare Providers:  IncredibleEmployment.be  This test is no t yet approved or cleared by the [place] FDA and  has been authorized for detection and/or diagnosis of SARS-CoV-2 by FDA under an Emergency Use  Authorization (EUA). This EUA will remain  in effect (meaning this test can be used) for the duration of the COVID-19 declaration under Section 564(b)(1) of the Act, 21 U.S.C.section 360bbb-3(b)(1), unless the authorization is terminated  or revoked sooner.       Influenza A by PCR NEGATIVE NEGATIVE   Influenza B by PCR NEGATIVE NEGATIVE    Comment: (NOTE) The Xpert Xpress SARS-CoV-2/FLU/RSV plus assay is intended as an aid in the diagnosis of influenza from Nasopharyngeal swab specimens and should not be used as a  sole basis for treatment. Nasal washings and aspirates are unacceptable for Xpert Xpress SARS-CoV-2/FLU/RSV testing.  Fact Sheet for Patients: EntrepreneurPulse.com.au  Fact Sheet for Healthcare Providers: IncredibleEmployment.be  This test is not yet approved or cleared by the [place] FDA and has been authorized for detection and/or diagnosis of SARS-CoV-2 by FDA under an Emergency Use Authorization (EUA). This EUA will remain in effect (meaning this test can be used) for the duration of the COVID-19 declaration under Section 564(b)(1) of the Act, 21 U.S.C. section 360bbb-3(b)(1), unless the authorization is terminated or revoked.  Performed at [facility], [place] [address], [place], [place] [address]   Hepatic function panel     Status: None   Collection Time: 03/19/21  1:16 PM  Result Value Ref Range   Total Protein 7.4 6.5 - 8.1 g/dL   Albumin 4.2 3.5 - 5.0 g/dL   AST 25 15 - 41 U/L   ALT 18 0 - 44 U/L   Alkaline Phosphatase 55 38 - 126 U/L   Total Bilirubin 0.8 0.3 - 1.2 mg/dL   Bilirubin, Direct 0.1 0.0 - 0.2 mg/dL   Indirect Bilirubin 0.7 0.3 - 0.9 mg/dL    Comment: Performed at [facility], [place] [address], [place], [place] [address]  Lactic acid, plasma     Status: Abnormal   Collection Time: 03/19/21  1:16 PM  Result Value Ref Range   Lactic Acid, Venous 2.3 (HH) 0.5 -  1.9 mmol/L    Comment: CRITICAL RESULT CALLED TO, READ BACK BY AND VERIFIED WITH: S WEST AT 1452 ON 03/19/2021 BY MOSLEY,J Performed at [facility], [place] [address], [place], [place] [address]   Troponin I (High Sensitivity)     Status: None   Collection Time: 03/19/21  1:16 PM  Result Value Ref Range   Troponin I (High Sensitivity) 6 <18 ng/L    Comment: (NOTE) Elevated high sensitivity troponin I (hsTnI) values and significant  changes across serial measurements may suggest ACS but many other  chronic and acute conditions are known to elevate hsTnI results.  Refer to the "Links" section for chest pain algorithms and additional  guidance. Performed at [facility], [place] [address], [place], [place] [address]   Brain natriuretic peptide     Status: Abnormal   Collection Time: 03/19/21  1:16 PM  Result Value Ref Range   B Natriuretic Peptide 114.0 (H) 0.0 - 100.0 pg/mL    Comment: Performed at [facility], [place] [address], [place], [place] [address]  Lactic acid, plasma     Status: None   Collection Time: 03/19/21  3:27 PM  Result Value Ref Range   Lactic Acid, Venous 1.9 0.5 - 1.9 mmol/L    Comment: Performed at [facility], [place] [address], [place], [place] [address]  Troponin I (High Sensitivity)     Status: None   Collection Time: 03/19/21  3:27 PM  Result Value Ref Range   Troponin I (High Sensitivity) 7 <18 ng/L    Comment: (NOTE) Elevated high sensitivity troponin I (hsTnI) values and significant  changes across serial measurements may suggest ACS but many other  chronic and acute conditions are known to elevate hsTnI results.  Refer to the "Links" section for chest pain algorithms and additional  guidance. Performed at [facility], [place] [address], [place], [place] [address]   Procalcitonin - Baseline     Status: None   Collection Time:  03/19/21  3:27 PM  Result Value Ref  Range   Procalcitonin <0.10 ng/mL    Comment:        Interpretation: PCT (Procalcitonin) <= 0.5 ng/mL: Systemic infection (sepsis) is not likely. Local bacterial infection is possible. (NOTE)       Sepsis PCT Algorithm           Lower Respiratory Tract                                      Infection PCT Algorithm    ----------------------------     ----------------------------         PCT < 0.25 ng/mL                PCT < 0.10 ng/mL          Strongly encourage             Strongly discourage   discontinuation of antibiotics    initiation of antibiotics    ----------------------------     -----------------------------       PCT 0.25 - 0.50 ng/mL            PCT 0.10 - 0.25 ng/mL               OR       >80% decrease in PCT            Discourage initiation of                                            antibiotics      Encourage discontinuation           of antibiotics    ----------------------------     -----------------------------         PCT >= 0.50 ng/mL              PCT 0.26 - 0.50 ng/mL               AND        <80% decrease in PCT             Encourage initiation of                                             antibiotics       Encourage continuation           of antibiotics    ----------------------------     -----------------------------        PCT >= 0.50 ng/mL                  PCT > 0.50 ng/mL               AND         increase in PCT                  Strongly encourage                                      initiation of antibiotics    Strongly encourage escalation  of antibiotics                                     -----------------------------                                           PCT <= 0.25 ng/mL                                                 OR                                        > 80% decrease in PCT                                      Discontinue / Do not initiate                                             antibiotics  Performed at [place] [address] [place], [place] [address]    DG Chest 1 View  Result Date: 03/19/2021 CLINICAL DATA:  Patient found down this morning. EXAM: CHEST  1 VIEW COMPARISON:  Single-view of the chest 12/01/2014. FINDINGS: Lung volumes are low with some basilar atelectasis. No pneumothorax or pleural effusion. Heart size is normal. Aortic atherosclerosis. The patient is status post reverse right shoulder arthroplasty. No acute bony abnormality. IMPRESSION: No acute disease. Aortic Atherosclerosis (ICD10-I70.0). Electronically Signed   By: Inge Rise M.D.   On: 03/19/2021 14:20   CT Head Wo Contrast  Result Date: 03/19/2021 CLINICAL DATA:  Dementia.  Fall.  Trauma to the head and neck. EXAM: CT HEAD WITHOUT CONTRAST CT CERVICAL SPINE WITHOUT CONTRAST TECHNIQUE: Multidetector CT imaging of the head and cervical spine was performed following the standard protocol without intravenous contrast. Multiplanar CT image reconstructions of the cervical spine were also generated. COMPARISON:  None. FINDINGS: CT HEAD FINDINGS Brain: The study suffers from some motion degradation. There is generalized atrophy. There chronic small-vessel ischemic changes of the white matter. No sign of acute infarction, mass lesion, hemorrhage, hydrocephalus or extra-axial collection. Vascular: There is atherosclerotic calcification of the major vessels at the base of the brain. Skull: Negative Sinuses/Orbits: Clear/normal Other: None CT CERVICAL SPINE FINDINGS Alignment: No traumatic malalignment. Skull base and vertebrae: No regional fracture or primary bone lesion. Soft tissues and spinal canal: Negative Disc levels: Chronic degenerative spondylosis from C3-4 through C6-7. This is most pronounced at C4-5 and C5-6 where there is disc space narrowing and endplate osteophytes with foraminal narrowing right worse than left at C4-5 and bilateral at C5-6. there is facet degenerative disease on the left at C7-T1. Upper  chest: Negative Other: None IMPRESSION: Head CT: No acute or traumatic finding. Atrophy and chronic small-vessel ischemic changes of the white matter. Cervical spine CT: No acute or traumatic finding. Ordinary cervical spondylosis. Electronically Signed  By: Nelson Chimes M.D.   On: 03/19/2021 14:15   CT Cervical Spine Wo Contrast  Result Date: 03/19/2021 CLINICAL DATA:  Dementia.  Fall.  Trauma to the head and neck. EXAM: CT HEAD WITHOUT CONTRAST CT CERVICAL SPINE WITHOUT CONTRAST TECHNIQUE: Multidetector CT imaging of the head and cervical spine was performed following the standard protocol without intravenous contrast. Multiplanar CT image reconstructions of the cervical spine were also generated. COMPARISON:  None. FINDINGS: CT HEAD FINDINGS Brain: The study suffers from some motion degradation. There is generalized atrophy. There chronic small-vessel ischemic changes of the white matter. No sign of acute infarction, mass lesion, hemorrhage, hydrocephalus or extra-axial collection. Vascular: There is atherosclerotic calcification of the major vessels at the base of the brain. Skull: Negative Sinuses/Orbits: Clear/normal Other: None CT CERVICAL SPINE FINDINGS Alignment: No traumatic malalignment. Skull base and vertebrae: No regional fracture or primary bone lesion. Soft tissues and spinal canal: Negative Disc levels: Chronic degenerative spondylosis from C3-4 through C6-7. This is most pronounced at C4-5 and C5-6 where there is disc space narrowing and endplate osteophytes with foraminal narrowing right worse than left at C4-5 and bilateral at C5-6. there is facet degenerative disease on the left at C7-T1. Upper chest: Negative Other: None IMPRESSION: Head CT: No acute or traumatic finding. Atrophy and chronic small-vessel ischemic changes of the white matter. Cervical spine CT: No acute or traumatic finding. Ordinary cervical spondylosis. Electronically Signed   By: Nelson Chimes M.D.   On: 03/19/2021 14:15    DG Hip Unilat With Pelvis 2-3 Views Right  Result Date: 03/19/2021 CLINICAL DATA:  Fall, unwitnessed fall generalized pain. EXAM: DG HIP (WITH OR WITHOUT PELVIS) 2-3V RIGHT COMPARISON:  April 26, 2020. FINDINGS: No signs of fracture of the bony pelvis. Soft tissues are unremarkable. RIGHT hip is located without visible fracture. Osteopenia and spinal degenerative changes. IMPRESSION: 1. No fracture or dislocation of the RIGHT hip. 2. Osteopenia and spinal degenerative changes. Electronically Signed   By: Zetta Bills M.D.   On: 03/19/2021 14:24    Pending Labs Unresulted Labs (From admission, onward)          Start     Ordered   03/20/21 6283  Basic metabolic panel  Daily,   R      03/19/21 1541   03/20/21 0500  CBC  Daily,   R      03/19/21 1541   03/19/21 1801  Urinalysis, Routine w reflex microscopic  Once,   STAT        03/19/21 1801   03/19/21 1249  Culture, blood (single)  ONCE - STAT,   STAT        03/19/21 1248          Vitals/Pain Today's Vitals   03/19/21 2030 03/19/21 2100 03/19/21 2130 03/19/21 2230  BP: 127/77 136/71 (!) 147/71 131/65  Pulse: 96 92 94 85  Resp: 19 20 (!) 23 18  Temp:      TempSrc:      SpO2: 93% 95% 93% 92%    Isolation Precautions Airborne and Contact precautions  Medications Medications  amLODipine (NORVASC) tablet 2.5 mg (2.5 mg Oral Given 03/19/21 1656)  levothyroxine (SYNTHROID) tablet 50 mcg (has no administration in time range)  acetaminophen (TYLENOL) tablet 650 mg (has no administration in time range)    Or  acetaminophen (TYLENOL) suppository 650 mg (has no administration in time range)  heparin injection 5,000 Units (5,000 Units Subcutaneous Given 03/19/21 2317)  QUEtiapine (SEROQUEL) tablet 25  mg (25 mg Oral Given 03/19/21 1739)  cefTRIAXone (ROCEPHIN) 1 g in sodium chloride 0.9 % 100 mL IVPB (0 g Intravenous Stopped 03/19/21 1917)  doxycycline (VIBRA-TABS) tablet 100 mg (100 mg Oral Given 03/19/21 2317)  haloperidol lactate  (HALDOL) injection 5 mg (5 mg Intramuscular Given 03/19/21 1258)    Mobility non-ambulatory

## 2021-03-20 NOTE — Progress Notes (Signed)
PT Cancellation Note  Patient Details Name: Emily Spence MRN: [id number] DOB: [birth year]   Cancelled Treatment:    Reason Eval/Treat Not Completed: Other (comment); pt refuses today. Son asks that we give her a day and try again on Thursday if possible. Will see again as schedule allows   [facility] 03/20/2021, 12:29 PM

## 2021-03-20 NOTE — Progress Notes (Signed)
PROGRESS NOTE    Emily Spence  VXB:[id number] DOB: 29-Feb-1922 DOA: 03/19/2021 PCP: Biagio Borg, MD  Chief Complaint  Patient presents with  . Fall   Brief Narrative:  This is Emily Spence 87 year old female with past medical history of advanced dementia, hypertension, hyperlipidemia, hypothyroidism, recurrent falls, RBBB was brought in from her independent living facility after she was found on the floor from suspected unwitnessed fall today.  Patient's son says that the patient has been living in an independent living facility for the past 20+ years with Emily Spence dog and bird and says she is essentially bedbound but will occasionally ambulate to the bathroom and back to bed.  He visits her daily to and from work and she has an aide who visits regularly as well.  After the patient's son had seen her this morning the patient's aide had checked on her about 2 hours later and found her on the floor suspected due to an unknown witnessed fall.  She's been admitted and was initially treated with abx for concern for possible infection.  At this point, working on getting pt higher level of care with return to [employer].  Palliative care following, pt's son open to hospice.    Assessment & Plan:   Principal Problem:   Acute respiratory failure with hypoxia (HCC) Active Problems:   Hypertension   Hypothyroidism   HLD (hyperlipidemia)   Dementia ([place])   Fall at home, initial encounter  1. Goals of Care: appreciate palliative care consult.  Pt previously in ILF, will need higher level of care.  Unfortunately, no SNF beds available at this time.  Carla at [facility] notes they're willing to find SNF to accept pt until bed becomes available at [facility].  Son is open to hospice, appreciate palliative care assistance.  2. Acute hypoxic respiratory failure, most likely from atelectasis vs CAP vs less likely volume overload Emily Spence. Currently satting low 90's on RA, with no increased WOB b. CXR unremarkable c. D/c  abx with low procal d. Low suspicion for PE. e. She is likely Emily Spence hospice candidate. Will consult palliative care  2. Unwitnessed fall Emily Spence. No fractures noted on imaging b. PT eval, she will likely need SNF at discharge  3. Advanced dementia Emily Spence. Palliative care consult b. Delirium precautions c. Ok for family to stay overnight given delirium   4. SIRS less likely sepsis  Emily Spence. Technically meets sepsis criteria: Tachypnea, leukocytosis and lactic acidosis, however clinically not septic b. Resolved  c. Follow blood cx d. Check [name] UA (pending) e. abx d/c'd  5. Hypertension Emily Spence. Continue home amlodipine  6. Hypothyroidism Emily Spence. Levothyroxine  DVT prophylaxis: heparin Code Status: DNR Family Communication: Son at bedside Disposition:   Status is: Observation  The patient will require care spanning > 2 midnights and should be moved to inpatient because: Inpatient level of care appropriate due to severity of illness  Dispo: The patient is from: ILF              Anticipated d/c is to: pending              Patient currently is not medically stable to d/c.   Difficult to place patient No       Consultants:   Palliative care  Procedures:  none  Antimicrobials:  Anti-infectives (From admission, onward)   Start     Dose/Rate Route Frequency Ordered Stop   03/19/21 2200  doxycycline (VIBRA-TABS) tablet 100 mg  Status:  Discontinued  100 mg Oral Every 12 hours 03/19/21 1801 03/20/21 1505   03/19/21 1800  cefTRIAXone (ROCEPHIN) 1 g in sodium chloride 0.9 % 100 mL IVPB  Status:  Discontinued        1 g 200 mL/hr over 30 Minutes Intravenous Every 24 hours 03/19/21 1801 03/20/21 1505         Subjective: Sleeping, hard of hearing - confused - pushing me away, tells me to come back later  Objective: Vitals:   03/20/21 0112 03/20/21 0633 03/20/21 0651 03/20/21 0956  BP: 130/78  (!) 142/67 (!) 146/75  Pulse: 83  88 93  Resp: 16  16 16   Temp: 98.2 F (36.8 C)  98.4 F  (36.9 C) 98.2 F (36.8 C)  TempSrc: Oral  Oral Oral  SpO2: (!) 88% 91% 91% 95%    Intake/Output Summary (Last 24 hours) at 03/20/2021 1505 Last data filed at 03/20/2021 1109 Gross per 24 hour  Intake 150 ml  Output 0 ml  Net 150 ml   There were no vitals filed for this visit.  Examination:  General exam: Appears calm and comfortable, frail, sleeping Respiratory system: unlabored Cardiovascular system: RRR Gastrointestinal system: Abdomen is nondistended, soft and nontender.  Central nervous system: confused, delirious - pushing me away during exam Extremities: no LEE   Data Reviewed: I have personally reviewed following labs and imaging studies  CBC: Recent Labs  Lab 03/19/21 1316 03/20/21 0321  WBC 12.8* 9.7  NEUTROABS 10.9*  --   HGB 15.1* 15.1*  HCT 46.2* 46.5*  MCV 94.5 94.7  PLT 141* 131*    Basic Metabolic Panel: Recent Labs  Lab 03/19/21 1316 03/20/21 0321  NA 142 140  K 4.0 4.5  CL 106 105  CO2 25 27  GLUCOSE 135* 141*  BUN 14 16  CREATININE 0.80 0.98  CALCIUM 9.2 9.0    GFR: CrCl cannot be calculated (Unknown ideal weight.).  Liver Function Tests: Recent Labs  Lab 03/19/21 1316  AST 25  ALT 18  ALKPHOS 55  BILITOT 0.8  PROT 7.4  ALBUMIN 4.2    CBG: No results for input(s): GLUCAP in the last 168 hours.   Recent Results (from the past 240 hour(s))  Resp Panel by RT-PCR (Flu [name]&B, Covid) Nasopharyngeal Swab     Status: None   Collection Time: 03/19/21  1:16 PM   Specimen: Nasopharyngeal Swab; Nasopharyngeal(NP) swabs in vial transport medium  Result Value Ref Range Status   SARS Coronavirus 2 by RT PCR NEGATIVE NEGATIVE Final    Comment: (NOTE) SARS-CoV-2 target nucleic acids are NOT DETECTED.  The SARS-CoV-2 RNA is generally detectable in upper respiratory specimens during the acute phase of infection. The lowest concentration of SARS-CoV-2 viral copies this assay can detect is 138 copies/mL. Obdulio Mash negative result does not  preclude SARS-Cov-2 infection and should not be used as the sole basis for treatment or other patient management decisions. Raechelle Sarti negative result may occur with  improper specimen collection/handling, submission of specimen other than nasopharyngeal swab, presence of viral mutation(s) within the areas targeted by this assay, and inadequate number of viral copies(<138 copies/mL). Azucena Dart negative result must be combined with clinical observations, patient history, and epidemiological information. The expected result is Negative.  Fact Sheet for Patients:  EntrepreneurPulse.com.au  Fact Sheet for Healthcare Providers:  IncredibleEmployment.be  This test is no t yet approved or cleared by the [place] FDA and  has been authorized for detection and/or diagnosis of SARS-CoV-2 by FDA under an Emergency  Use Authorization (EUA). This EUA will remain  in effect (meaning this test can be used) for the duration of the COVID-19 declaration under Section 564(b)(1) of the Act, 21 U.S.C.section 360bbb-3(b)(1), unless the authorization is terminated  or revoked sooner.       Influenza Virdell Hoiland by PCR NEGATIVE NEGATIVE Final   Influenza B by PCR NEGATIVE NEGATIVE Final    Comment: (NOTE) The Xpert Xpress SARS-CoV-2/FLU/RSV plus assay is intended as an aid in the diagnosis of influenza from Nasopharyngeal swab specimens and should not be used as Lem Peary sole basis for treatment. Nasal washings and aspirates are unacceptable for Xpert Xpress SARS-CoV-2/FLU/RSV testing.  Fact Sheet for Patients: EntrepreneurPulse.com.au  Fact Sheet for Healthcare Providers: IncredibleEmployment.be  This test is not yet approved or cleared by the [place] FDA and has been authorized for detection and/or diagnosis of SARS-CoV-2 by FDA under an Emergency Use Authorization (EUA). This EUA will remain in effect (meaning this test can be used) for the  duration of the COVID-19 declaration under Section 564(b)(1) of the Act, 21 U.S.C. section 360bbb-3(b)(1), unless the authorization is terminated or revoked.  Performed at [facility], [place] [address], [place], [place] [address]   Culture, blood (single)     Status: None (Preliminary result)   Collection Time: 03/19/21  2:00 PM   Specimen: Site Not Specified; Blood  Result Value Ref Range Status   Specimen Description   Final    SITE NOT SPECIFIED Performed at [facility] [address], [place], [place] [address]    Special Requests   Final    BOTTLES DRAWN AEROBIC ONLY Blood Culture adequate volume Performed at [facility] [address] [place], [place] [address]    Culture   Final    NO GROWTH < 24 HOURS Performed at [facility] [address], [place], [place] [address]    Report Status PENDING  Incomplete         Radiology Studies: DG Chest 1 View  Result Date: 03/19/2021 CLINICAL DATA:  Patient found down this morning. EXAM: CHEST  1 VIEW COMPARISON:  Single-view of the chest 12/01/2014. FINDINGS: Lung volumes are low with some basilar atelectasis. No pneumothorax or pleural effusion. Heart size is normal. Aortic atherosclerosis. The patient is status post reverse right shoulder arthroplasty. No acute bony abnormality. IMPRESSION: No acute disease. Aortic Atherosclerosis (ICD10-I70.0). Electronically Signed   By: Inge Rise M.D.   On: 03/19/2021 14:20   CT Head Wo Contrast  Result Date: 03/19/2021 CLINICAL DATA:  Dementia.  Fall.  Trauma to the head and neck. EXAM: CT HEAD WITHOUT CONTRAST CT CERVICAL SPINE WITHOUT CONTRAST TECHNIQUE: Multidetector CT imaging of the head and cervical spine was performed following the standard protocol without intravenous contrast. Multiplanar CT image reconstructions of the cervical spine were also generated. COMPARISON:  None. FINDINGS: CT HEAD FINDINGS Brain:  The study suffers from some motion degradation. There is generalized atrophy. There chronic small-vessel ischemic changes of the white matter. No sign of acute infarction, mass lesion, hemorrhage, hydrocephalus or extra-axial collection. Vascular: There is atherosclerotic calcification of the major vessels at the base of the brain. Skull: Negative Sinuses/Orbits: Clear/normal Other: None CT CERVICAL SPINE FINDINGS Alignment: No traumatic malalignment. Skull base and vertebrae: No regional fracture or primary bone lesion. Soft tissues and spinal canal: Negative Disc levels: Chronic degenerative spondylosis from C3-4 through C6-7. This is most pronounced at C4-5 and C5-6 where there is disc space narrowing and  endplate osteophytes with foraminal narrowing right worse than left at C4-5 and bilateral at C5-6. there is facet degenerative disease on the left at C7-T1. Upper chest: Negative Other: None IMPRESSION: Head CT: No acute or traumatic finding. Atrophy and chronic small-vessel ischemic changes of the white matter. Cervical spine CT: No acute or traumatic finding. Ordinary cervical spondylosis. Electronically Signed   By: Nelson Chimes M.D.   On: 03/19/2021 14:15   CT Cervical Spine Wo Contrast  Result Date: 03/19/2021 CLINICAL DATA:  Dementia.  Fall.  Trauma to the head and neck. EXAM: CT HEAD WITHOUT CONTRAST CT CERVICAL SPINE WITHOUT CONTRAST TECHNIQUE: Multidetector CT imaging of the head and cervical spine was performed following the standard protocol without intravenous contrast. Multiplanar CT image reconstructions of the cervical spine were also generated. COMPARISON:  None. FINDINGS: CT HEAD FINDINGS Brain: The study suffers from some motion degradation. There is generalized atrophy. There chronic small-vessel ischemic changes of the white matter. No sign of acute infarction, mass lesion, hemorrhage, hydrocephalus or extra-axial collection. Vascular: There is atherosclerotic calcification of the major  vessels at the base of the brain. Skull: Negative Sinuses/Orbits: Clear/normal Other: None CT CERVICAL SPINE FINDINGS Alignment: No traumatic malalignment. Skull base and vertebrae: No regional fracture or primary bone lesion. Soft tissues and spinal canal: Negative Disc levels: Chronic degenerative spondylosis from C3-4 through C6-7. This is most pronounced at C4-5 and C5-6 where there is disc space narrowing and endplate osteophytes with foraminal narrowing right worse than left at C4-5 and bilateral at C5-6. there is facet degenerative disease on the left at C7-T1. Upper chest: Negative Other: None IMPRESSION: Head CT: No acute or traumatic finding. Atrophy and chronic small-vessel ischemic changes of the white matter. Cervical spine CT: No acute or traumatic finding. Ordinary cervical spondylosis. Electronically Signed   By: Nelson Chimes M.D.   On: 03/19/2021 14:15   DG Hip Unilat With Pelvis 2-3 Views Right  Result Date: 03/19/2021 CLINICAL DATA:  Fall, unwitnessed fall generalized pain. EXAM: DG HIP (WITH OR WITHOUT PELVIS) 2-3V RIGHT COMPARISON:  April 26, 2020. FINDINGS: No signs of fracture of the bony pelvis. Soft tissues are unremarkable. RIGHT hip is located without visible fracture. Osteopenia and spinal degenerative changes. IMPRESSION: 1. No fracture or dislocation of the RIGHT hip. 2. Osteopenia and spinal degenerative changes. Electronically Signed   By: Zetta Bills M.D.   On: 03/19/2021 14:24        Scheduled Meds: . amLODipine  2.5 mg Oral Daily  . heparin  5,000 Units Subcutaneous Q8H  . levothyroxine  50 mcg Oral Q0600  . QUEtiapine  25 mg Oral QHS  . thiamine injection  100 mg Intravenous Daily   Continuous Infusions: . dextrose 5% lactated ringers       LOS: 0 days    Time spent: over 30 min    Fayrene Helper, MD [facility]   To contact the attending provider between 7A-7P or the covering provider during after hours 7P-7A, please log into the web  site [email or web address] and access using universal [place] password for that web site. If you do not have the password, please call the hospital operator.  03/20/2021, 3:05 PM

## 2021-03-20 NOTE — TOC Initial Note (Signed)
Transition of Care [facility]) - Initial/Assessment Note   Patient Details  Name: Emily Spence MRN: [id number] Date of Birth: 11/09/1921  Transition of Care [facility]) CM/SW Contact:    Emily Don, LCSW Phone Number: 03/20/2021, 1:43 PM  Clinical Narrative: Patient is a 87 year old female who was admitted for acute respiratory failure with hypoxia. Patient is a resident of [facility]. CSW was contacted by Emily Spence with [facility] regarding patient returning to the facility. Per Emily Spence, patient will not be able to return to independent living and would need to return under SNF or hospice depending on the recommendations of the patient's treatment team. [facility] does not have a SNF bed available at this time, but Emily Spence is willing to find a SNF to accept the patient until a bed does become available. If patient needs hospice services, the facility uses [facility].  CSW met with patient's son and daughter. Patient was lethargic and somewhat disoriented during the discussion. Family is considering options for the patient to discharge with hospice services. Son reported he does not think patient will be able to tolerate SNF for rehab.  CSW received call from [place] with [facility]. Per Emily Spence, she is working with Emily Spence at [facility] to ensure patient has a discharge plan in place with the needed services prior to discharge. [facility] will be able to temporarily provide hospice services in another SNF until a bed becomes available at [facility]. TOC to follow.  Expected Discharge Plan: [place] (Patient may discharge to a SNF with hospice services) Barriers to Discharge: Continued Medical Work up  Patient Goals and CMS Choice Patient states their goals for this hospitalization and ongoing recovery are:: Arrange hospice services for patient at a facility CMS Medicare.gov Compare Post Acute Care list provided to:: Patient Represenative (must comment) (Son and daughter) Choice  offered to / list presented to : Adult Children  Expected Discharge Plan and Services Expected Discharge Plan: [place] (Patient may discharge to a SNF with hospice services) In-house Referral: Clinical Social Work Post Acute Care Choice: Hospice Living arrangements for the past 2 months: [place]            DME Arranged: N/A DME Agency: NA  Prior Living Arrangements/Services Living arrangements for the past 2 months: [place] Lives with:: Self Patient language and need for interpreter reviewed:: Yes Do you feel safe going back to the place where you live?: Yes      Need for Family Participation in Patient Care: Yes (Comment) (Patient has dementia) Care giver support system in place?: Yes (comment) Criminal Activity/Legal Involvement Pertinent to Current Situation/Hospitalization: No - Comment as needed  Activities of Daily Living Home Assistive Devices/Equipment: Walker (specify type) ADL Screening (condition at time of admission) Patient's cognitive ability adequate to safely complete daily activities?: No Is the patient deaf or have difficulty hearing?: Yes Does the patient have difficulty seeing, even when wearing glasses/contacts?: No Does the patient have difficulty concentrating, remembering, or making decisions?: Yes Patient able to express need for assistance with ADLs?: No Does the patient have difficulty dressing or bathing?: Yes Independently performs ADLs?: No Communication: Independent (confused) Dressing (OT): Needs assistance Is this a change from baseline?: Pre-admission baseline Grooming: Needs assistance Is this a change from baseline?: Pre-admission baseline Feeding: Independent Bathing: Needs assistance Is this a change from baseline?: Pre-admission baseline Toileting: Needs assistance Is this a change from baseline?: Pre-admission baseline In/Out Bed: Needs assistance Is this a change from  baseline?:  Pre-admission baseline Walks in Home: Needs assistance Is this a change from baseline?: Pre-admission baseline Does the patient have difficulty walking or climbing stairs?: Yes Weakness of Legs: Both Weakness of Arms/Hands: None  Permission Sought/Granted Permission sought to share information with : Facility Art therapist granted to share information with : Yes, Verbal Permission Granted Permission granted to share info w AGENCY: SNFs that can accept patient under hospice, hospice services  Emotional Assessment Appearance:: Appears stated age Attitude/Demeanor/Rapport: Lethargic Orientation: : Oriented to Self Alcohol / Substance Use: Not Applicable Psych Involvement: No (comment)  Admission diagnosis:  Fall [W19.XXXA] Hypoxia [R09.02] Acute respiratory failure with hypoxia ([place]) [J96.01] Fall, initial encounter [W19.XXXA] Patient Active Problem List   Diagnosis Date Noted  . Acute respiratory failure with hypoxia ([place]) 03/19/2021  . Dementia ([place]) 03/19/2021  . Fall at home, initial encounter 03/19/2021  . Dysuria 10/28/2019  . Memory loss 06/05/2018  . HLD (hyperlipidemia)   . Urge urinary incontinence   . RBBB   . Osteoporosis   . Female cystocele   . Closed compression fracture of L1 lumbar vertebra   . Hyperglycemia 09/05/2017  . Preventative health care 09/05/2017  . Abdominal mass 09/05/2017  . Anemia 09/05/2017  . Hypertension   . Hypothyroidism   . Allergic rhinitis   . Fracture of humerus, proximal, right, closed 12/09/2014  . Closed 4-part fracture of proximal end of right humerus 12/01/2014   PCP:  Biagio Borg, MD Pharmacy:   [facility], [place] [address] Phone: [phone] Fax: [phone]  Readmission Risk Interventions No flowsheet data found.

## 2021-03-21 DIAGNOSIS — J9601 Acute respiratory failure with hypoxia: Secondary | ICD-10-CM | POA: Diagnosis not present

## 2021-03-21 LAB — COMPREHENSIVE METABOLIC PANEL
ALT: 16 U/L (ref 0–44)
AST: 26 U/L (ref 15–41)
Albumin: 3.8 g/dL (ref 3.5–5.0)
Alkaline Phosphatase: 55 U/L (ref 38–126)
Anion gap: 9 (ref 5–15)
BUN: 12 mg/dL (ref 8–23)
CO2: 27 mmol/L (ref 22–32)
Calcium: 8.9 mg/dL (ref 8.9–10.3)
Chloride: 105 mmol/L (ref 98–111)
Creatinine, Ser: 0.7 mg/dL (ref 0.44–1.00)
GFR, Estimated: >60 mL/min (ref >60)
Glucose, Bld: 166 mg/dL — ABNORMAL HIGH (ref 70–99)
Potassium: 4 mmol/L (ref 3.5–5.1)
Sodium: 141 mmol/L (ref 135–145)
Total Bilirubin: 1.2 mg/dL (ref 0.3–1.2)
Total Protein: 7.5 g/dL (ref 6.5–8.1)

## 2021-03-21 LAB — CBC WITH DIFFERENTIAL/PLATELET
Abs Immature Granulocytes: 0.05 10*3/uL (ref 0.00–0.07)
Basophils Absolute: 0 10*3/uL (ref 0.0–0.1)
Basophils Relative: 0 %
Eosinophils Absolute: 0 10*3/uL (ref 0.0–0.5)
Eosinophils Relative: 0 %
HCT: 49.1 % — ABNORMAL HIGH (ref 36.0–46.0)
Hemoglobin: 15.9 g/dL — ABNORMAL HIGH (ref 12.0–15.0)
Immature Granulocytes: 0 %
Lymphocytes Relative: 8 %
Lymphs Abs: 0.9 10*3/uL (ref 0.7–4.0)
MCH: 30.9 pg (ref 26.0–34.0)
MCHC: 32.4 g/dL (ref 30.0–36.0)
MCV: 95.3 fL (ref 80.0–100.0)
Monocytes Absolute: 0.9 10*3/uL (ref 0.1–1.0)
Monocytes Relative: 8 %
Neutro Abs: 9.4 10*3/uL — ABNORMAL HIGH (ref 1.7–7.7)
Neutrophils Relative %: 84 %
Platelets: 125 10*3/uL — ABNORMAL LOW (ref 150–400)
RBC: 5.15 MIL/uL — ABNORMAL HIGH (ref 3.87–5.11)
RDW: 14.1 % (ref 11.5–15.5)
WBC: 11.3 10*3/uL — ABNORMAL HIGH (ref 4.0–10.5)
nRBC: 0 % (ref 0.0–0.2)

## 2021-03-21 LAB — PHOSPHORUS: Phosphorus: 2.2 mg/dL — ABNORMAL LOW (ref 2.5–4.6)

## 2021-03-21 LAB — SARS CORONAVIRUS 2 (TAT 6-24 HRS): SARS Coronavirus 2: NEGATIVE

## 2021-03-21 LAB — MAGNESIUM: Magnesium: 2.1 mg/dL (ref 1.7–2.4)

## 2021-03-21 MED ORDER — HALOPERIDOL LACTATE 2 MG/ML PO CONC
2.0000 mg | Freq: Two times a day (BID) | ORAL | Status: DC | PRN
  Filled 2021-03-21: qty 1

## 2021-03-21 MED ORDER — HALOPERIDOL LACTATE 2 MG/ML PO CONC: 2.0000 mg | Freq: Two times a day (BID) | ORAL | Status: DC | PRN

## 2021-03-21 NOTE — TOC Progression Note (Addendum)
Transition of Care [facility]) - Progression Note   Patient Details  Name: Emily Spence MRN: [id number] Date of Birth: [birth year]  Transition of [facility] CM/SW [place], LCSW Phone Number: 03/21/2021, 12:30 PM  Clinical Narrative: CSW received call from [place] with [facility]. Per Angela Nevin, the facility will have a bed available on Thursday (03/22/21) for the patient to come to SNF with hospice. The patient will go under long-term care. The room number is 109B and the RN supervisor, Lendell Caprice, will be called for report [phone]). CSW updated treatment team. Patient will need a negative COVID test prior to return. TOC to follow.  Expected Discharge Plan: [place] (Patient may discharge to a SNF with hospice services) Barriers to Discharge: Continued Medical Work up  Expected Discharge Plan and Services Expected Discharge Plan: [place] (Patient may discharge to a SNF with hospice services) In-house Referral: Clinical Social Work Post Acute Care Choice: Hospice Living arrangements for the past 2 months: [place]             DME Arranged: N/A DME Agency: NA  Readmission Risk Interventions No flowsheet data found.

## 2021-03-21 NOTE — Progress Notes (Signed)
PROGRESS NOTE    Emily Spence  TDV:[id number] DOB: [birth year] DOA: 03/19/2021 PCP: Biagio Borg, MD  Chief Complaint  Patient presents with  . Fall   Brief Narrative:  This is a 87 year old female with past medical history of advanced dementia, hypertension, hyperlipidemia, hypothyroidism, recurrent falls, RBBB was brought in from her independent living facility after she was found on the floor from suspected unwitnessed fall today.  Patient's son says that the patient has been living in an independent living facility for the past 20+ years with a dog and bird and says she is essentially bedbound but will occasionally ambulate to the bathroom and back to bed.  He visits her daily to and from work and she has an aide who visits regularly as well.  After the patient's son had seen her this morning the patient's aide had checked on her about 2 hours later and found her on the floor suspected due to an unknown witnessed fall.  She's been admitted and was initially treated with abx for concern for possible infection.  At this point, working on getting pt higher level of care with return to [employer].  Palliative care following, pt's son open to hospice.    Assessment & Plan:   Principal Problem:   Acute respiratory failure with hypoxia (HCC) Active Problems:   Hypertension   Hypothyroidism   HLD (hyperlipidemia)   Dementia (HCC)   Fall at home, initial encounter   AMS (altered mental status)  Goals of Care:  Pt previously in ILF, will need higher level of care.  D/c to  [facility] with hospice, .appreciate palliative care consult.   Acute hypoxic respiratory failure, most likely from atelectasis vs CAP vs less likely volume overload a. Currently satting low 90's on RA, with no increased WOB b. CXR unremarkable c. D/c abx with low procal d. Low suspicion for PE. D/c to  [facility] with hospice, .appreciate palliative care consult.    Unwitnessed fall No fractures noted on  imaging D/c to  [facility] with hospice, .appreciate palliative care consult.    3. Advanced dementia a. Palliative care consult b. Delirium precautions c. Ok for family to stay overnight given delirium   4. SIRS less likely sepsis  a. Technically meets sepsis criteria: Tachypnea, leukocytosis and lactic acidosis, however clinically not septic b. Resolved  c. Follow blood cx d. ua ordered, not obtained  e. abx d/c'd D/c to  [facility] with hospice, .appreciate palliative care consult.    5. Hypertension a. Continue home amlodipine  6. Hypothyroidism a. Levothyroxine  DVT prophylaxis: heparin Code Status: DNR Family Communication: none at bedside  Disposition:   Status is: Inpatient      Dispo: The patient is from: ILF              Anticipated d/c is to:D/c to  [facility] with hospice on 5/26    Consultants:   Palliative care  Procedures:  none  Antimicrobials:  Anti-infectives (From admission, onward)   Start     Dose/Rate Route Frequency Ordered Stop   03/19/21 2200  doxycycline (VIBRA-TABS) tablet 100 mg  Status:  Discontinued        100 mg Oral Every 12 hours 03/19/21 1801 03/20/21 1505   03/19/21 1800  cefTRIAXone (ROCEPHIN) 1 g in sodium chloride 0.9 % 100 mL IVPB  Status:  Discontinued        1 g 200 mL/hr over 30 Minutes Intravenous Every 24 hours 03/19/21 1801 03/20/21 1505  Subjective: Sleeping, hard of hearing - confused - calm , no agitation currently, does not follow commands  Objective: Vitals:   03/20/21 0956 03/20/21 2100 03/21/21 0531 03/21/21 1419  BP: (!) 146/75 139/70 133/61 (!) 150/78  Pulse: 93 90 96 95  Resp: 16 17 17 19   Temp: 98.2 F (36.8 C) 98.7 F (37.1 C) 98 F (36.7 C) 98.2 F (36.8 C)  TempSrc: Oral Axillary Axillary Axillary  SpO2: 95% 98% (!) 89% 96%    Intake/Output Summary (Last 24 hours) at 03/21/2021 1747 Last data filed at 03/21/2021 1718 Gross per 24 hour  Intake 30 ml  Output 600 ml  Net  -570 ml   There were no vitals filed for this visit.  Examination:  General exam: Appears calm and comfortable, frail, sleeping Respiratory system: unlabored Cardiovascular system: RRR Gastrointestinal system: Abdomen is nondistended, soft and nontender.  Central nervous system: confused, no agitation currently Extremities: no LEE   Data Reviewed: I have personally reviewed following labs and imaging studies  CBC: Recent Labs  Lab 03/19/21 1316 03/20/21 0321 03/21/21 0323  WBC 12.8* 9.7 11.3*  NEUTROABS 10.9*  --  9.4*  HGB 15.1* 15.1* 15.9*  HCT 46.2* 46.5* 49.1*  MCV 94.5 94.7 95.3  PLT 141* 131* 125*    Basic Metabolic Panel: Recent Labs  Lab 03/19/21 1316 03/20/21 0321 03/21/21 0323  NA 142 140 141  K 4.0 4.5 4.0  CL 106 105 105  CO2 25 27 27   GLUCOSE 135* 141* 166*  BUN 14 16 12   CREATININE 0.80 0.98 0.70  CALCIUM 9.2 9.0 8.9  MG  --   --  2.1  PHOS  --   --  2.2*    GFR: CrCl cannot be calculated (Unknown ideal weight.).  Liver Function Tests: Recent Labs  Lab 03/19/21 1316 03/21/21 0323  AST 25 26  ALT 18 16  ALKPHOS 55 55  BILITOT 0.8 1.2  PROT 7.4 7.5  ALBUMIN 4.2 3.8    CBG: No results for input(s): GLUCAP in the last 168 hours.   Recent Results (from the past 240 hour(s))  Resp Panel by RT-PCR (Flu A&B, Covid) Nasopharyngeal Swab     Status: None   Collection Time: 03/19/21  1:16 PM   Specimen: Nasopharyngeal Swab; Nasopharyngeal(NP) swabs in vial transport medium  Result Value Ref Range Status   SARS Coronavirus 2 by RT PCR NEGATIVE NEGATIVE Final    Comment: (NOTE) SARS-CoV-2 target nucleic acids are NOT DETECTED.  The SARS-CoV-2 RNA is generally detectable in upper respiratory specimens during the acute phase of infection. The lowest concentration of SARS-CoV-2 viral copies this assay can detect is 138 copies/mL. A negative result does not preclude SARS-Cov-2 infection and should not be used as the sole basis for treatment  or other patient management decisions. A negative result may occur with  improper specimen collection/handling, submission of specimen other than nasopharyngeal swab, presence of viral mutation(s) within the areas targeted by this assay, and inadequate number of viral copies(<138 copies/mL). A negative result must be combined with clinical observations, patient history, and epidemiological information. The expected result is Negative.  Fact Sheet for Patients:  EntrepreneurPulse.com.au  Fact Sheet for Healthcare Providers:  IncredibleEmployment.be  This test is no t yet approved or cleared by the [place] FDA and  has been authorized for detection and/or diagnosis of SARS-CoV-2 by FDA under an Emergency Use Authorization (EUA). This EUA will remain  in effect (meaning this test can be used) for the  duration of the COVID-19 declaration under Section 564(b)(1) of the Act, 21 U.S.C.section 360bbb-3(b)(1), unless the authorization is terminated  or revoked sooner.       Influenza A by PCR NEGATIVE NEGATIVE Final   Influenza B by PCR NEGATIVE NEGATIVE Final    Comment: (NOTE) The Xpert Xpress SARS-CoV-2/FLU/RSV plus assay is intended as an aid in the diagnosis of influenza from Nasopharyngeal swab specimens and should not be used as a sole basis for treatment. Nasal washings and aspirates are unacceptable for Xpert Xpress SARS-CoV-2/FLU/RSV testing.  Fact Sheet for Patients: EntrepreneurPulse.com.au  Fact Sheet for Healthcare Providers: IncredibleEmployment.be  This test is not yet approved or cleared by the [place] FDA and has been authorized for detection and/or diagnosis of SARS-CoV-2 by FDA under an Emergency Use Authorization (EUA). This EUA will remain in effect (meaning this test can be used) for the duration of the COVID-19 declaration under Section 564(b)(1) of the Act, 21 U.S.C. section  360bbb-3(b)(1), unless the authorization is terminated or revoked.  Performed at [facility], [place] [address], [place], [place] [address]   Culture, blood (single)     Status: None (Preliminary result)   Collection Time: 03/19/21  2:00 PM   Specimen: Site Not Specified; Blood  Result Value Ref Range Status   Specimen Description   Final    SITE NOT SPECIFIED Performed at [facility] [address], [place], [place] [address]    Special Requests   Final    BOTTLES DRAWN AEROBIC ONLY Blood Culture adequate volume Performed at [facility] [address] [place], [place] [address]    Culture   Final    NO GROWTH 2 DAYS Performed at [facility] [address] [place], [place] [address]    Report Status PENDING  Incomplete         Radiology Studies: No results found.      Scheduled Meds: . amLODipine  2.5 mg Oral Daily  . heparin  5,000 Units Subcutaneous Q8H  . levothyroxine  50 mcg Oral Q0600  . QUEtiapine  25 mg Oral QHS  . thiamine injection  100 mg Intravenous Daily   Continuous Infusions: . dextrose 5% lactated ringers 50 mL/hr at 03/21/21 1002     LOS: 1 day    Time spent: 15 min    Florencia Reasons, MD PhD FACP [facility]   To contact the attending provider between 7A-7P or the covering provider during after hours 7P-7A, please log into the web site [email or web address] and access using universal [place] password for that web site. If you do not have the password, please call the hospital operator.  03/21/2021, 5:47 PM

## 2021-03-21 NOTE — Progress Notes (Signed)
PT Cancellation Note  Patient Details Name: Emily Spence MRN: [id number] DOB: September 30, 1922   Cancelled Treatment:    Reason Eval/Treat Not Completed: Other (comment) Per report yesterday son had requested PT to check back tomorrow for possible eval.  Did check today and pt not able to participate.  She is confused, not following any commands, and states "your killing me" with even the slightest movement of legs or trying to reposition.  Also, noted plan is for SNF with hospice.  Consider signing off PT if no improvement tomorrow. Abran Richard, PT Acute Rehab Services Pager [phone] [facility] [place] 03/21/2021, 1:08 PM

## 2021-03-22 DIAGNOSIS — R296 Repeated falls: Secondary | ICD-10-CM

## 2021-03-22 DIAGNOSIS — Z7189 Other specified counseling: Secondary | ICD-10-CM | POA: Diagnosis not present

## 2021-03-22 DIAGNOSIS — J9601 Acute respiratory failure with hypoxia: Secondary | ICD-10-CM | POA: Diagnosis not present

## 2021-03-22 DIAGNOSIS — F0390 Unspecified dementia without behavioral disturbance: Secondary | ICD-10-CM

## 2021-03-22 MED ORDER — QUETIAPINE FUMARATE 25 MG PO TABS: 25.0000 mg | ORAL_TABLET | Freq: Every day | ORAL | Status: AC

## 2021-03-22 NOTE — NC FL2 (Signed)
[place] LEVEL OF CARE SCREENING TOOL     IDENTIFICATION  Patient Name: Emily Spence Birthdate: 06/11/1922 Sex: female Admission Date (Current Location): 03/19/2021  [facility] and [place] Number:  Herbalist and Address:  [facility],  [place], [place]      Provider Number: [id number]  Attending Physician Name and Address:  Florencia Reasons, MD  Relative Name and Phone Number:  Alissia Lory (son) Ph: [phone]    Current Level of Care: Hospital Recommended Level of Care: [place] (Discharging to SNF under hospice) Prior Approval Number:    Date Approved/Denied:   PASRR Number:    Discharge Plan: SNF    Current Diagnoses: Patient Active Problem List   Diagnosis Date Noted  . AMS (altered mental status) 03/20/2021  . Acute respiratory failure with hypoxia ([place]) 03/19/2021  . Dementia ([place]) 03/19/2021  . Fall at home, initial encounter 03/19/2021  . Dysuria 10/28/2019  . Memory loss 06/05/2018  . HLD (hyperlipidemia)   . Urge urinary incontinence   . RBBB   . Osteoporosis   . Female cystocele   . Closed compression fracture of L1 lumbar vertebra   . Hyperglycemia 09/05/2017  . Preventative health care 09/05/2017  . Abdominal mass 09/05/2017  . Anemia 09/05/2017  . Hypertension   . Hypothyroidism   . Allergic rhinitis   . Fracture of humerus, proximal, right, closed 12/09/2014  . Closed 4-part fracture of proximal end of right humerus 12/01/2014    Orientation RESPIRATION BLADDER Height & Weight     Self  Normal Incontinent Weight:   Height:     BEHAVIORAL SYMPTOMS/MOOD NEUROLOGICAL BOWEL NUTRITION STATUS      Incontinent Diet (Regular diet)  AMBULATORY STATUS COMMUNICATION OF NEEDS Skin   Extensive Assist Verbally Other (Comment) (Ecchymosis)                       Personal Care Assistance Level of Assistance  Bathing,Feeding,Dressing Bathing Assistance: Maximum  assistance Feeding assistance: Limited assistance Dressing Assistance: Maximum assistance     Functional Limitations Info  Sight,Hearing,Speech Sight Info: Impaired Hearing Info: Impaired Speech Info: Adequate    SPECIAL CARE FACTORS FREQUENCY                       Contractures Contractures Info: Not present    Additional Factors Info  Code Status,Allergies,Psychotropic Code Status Info: DNR Allergies Info: Elemental Sulfur, Simvastatin Psychotropic Info: Seroquel         Current Medications (03/22/2021):  This is the current hospital active medication list Current Facility-Administered Medications  Medication Dose Route Frequency Provider Last Rate Last Admin  . acetaminophen (TYLENOL) tablet 650 mg  650 mg Oral Q6H PRN Harold Hedge, MD       Or  . acetaminophen (TYLENOL) suppository 650 mg  650 mg Rectal Q6H PRN Harold Hedge, MD      . amLODipine (NORVASC) tablet 2.5 mg  2.5 mg Oral Daily Harold Hedge, MD   2.5 mg at 03/22/21 0933  . dextrose 5 % in lactated ringers infusion   Intravenous Continuous Elodia Florence., MD 50 mL/hr at 03/22/21 0648 New Bag at 03/22/21 0938  . haloperidol (HALDOL) 2 MG/ML solution 2 mg  2 mg Sublingual BID PRN Pershing Proud, NP      . heparin injection 5,000 Units  5,000 Units Subcutaneous Q8H Harold Hedge, MD   5,000  Units at 03/22/21 0626  . levothyroxine (SYNTHROID) tablet 50 mcg  50 mcg Oral Q0600 Harold Hedge, MD      . QUEtiapine (SEROQUEL) tablet 25 mg  25 mg Oral QHS Harold Hedge, MD   25 mg at 03/21/21 2127  . thiamine (B-1) injection 100 mg  100 mg Intravenous Daily Elodia Florence., MD   100 mg at 03/22/21 0935     Discharge Medications: Please see discharge summary for a list of discharge medications.  STOP taking these medications   donepezil 5 MG tablet Commonly known as: ARICEPT     TAKE these medications   amLODipine 2.5 MG tablet Commonly known as: NORVASC TAKE 1 TABLET BY MOUTH   DAILY   cholecalciferol 1000 units tablet Commonly known as: VITAMIN D Take 1,000 Units by mouth daily.   levothyroxine 50 MCG tablet Commonly known as: SYNTHROID TAKE 1 TABLET BY MOUTH  DAILY EXCEPT TAKE 1 AND 1/2 TABLETS ON TUESDAY AND  THURSDAY What changed: See the new instructions.   QUEtiapine 25 MG tablet Commonly known as: SEROQUEL Take 1 tablet (25 mg total) by mouth at bedtime.    Relevant Imaging Results:  Relevant Lab Results:   Additional Information SSN: [id number]  Sherie Don, LCSW

## 2021-03-22 NOTE — Progress Notes (Signed)
Daily Progress Note   Patient Name: Emily Spence       Date: 03/22/2021 DOB: Sep 10, 1922  Age: 87 y.o. MRN#: [id number] Attending Physician: Florencia Reasons, MD Primary Care Physician: Biagio Borg, MD Admit Date: 03/19/2021  Reason for Consultation/Follow-up: Establishing goals of care and Psychosocial/spiritual support  Subjective: Medical records reviewed. Discussed with NT and assessed patient at the bedside. She is alert and eating her breakfast with assistance, denies pain or distress.  Called patient's son [name] to provide updates, support, and clarify goals of care. He is glad to hear patient is eating today and continues to voice his preference for discharge to [facility] SNF with hospice. He agrees that the patient seems to be doing well enough to return to [facility] rather than residential hospice, as she was "fiesty" during his visit earlier this morning. Her appetite is improved compared to recent days. Therapeutic listening and emotional support provided as he shares his gratitude and expectations for patient's return to a consistent environment. He is very satisfied with his recent tours of the facility and the communication he is receiving.  Questions and concerns address. PMT will continue to support holistically.   Length of Stay: 2  Current Medications: Scheduled Meds:  . amLODipine  2.5 mg Oral Daily  . heparin  5,000 Units Subcutaneous Q8H  . levothyroxine  50 mcg Oral Q0600  . QUEtiapine  25 mg Oral QHS  . thiamine injection  100 mg Intravenous Daily    Continuous Infusions: . dextrose 5% lactated ringers 50 mL/hr at 03/22/21 0648    PRN Meds: acetaminophen **OR** acetaminophen, haloperidol  Physical Exam Constitutional:      Appearance: She is  ill-appearing.  Cardiovascular:     Rate and Rhythm: Normal rate.  Pulmonary:     Effort: Pulmonary effort is normal.  Neurological:     Mental Status: She is alert. She is confused.  Psychiatric:        Mood and Affect: Mood normal.        Behavior: Behavior is cooperative.             Vital Signs: BP (!) 172/69 (BP Location: Left Leg)   Pulse 96   Temp 98.9 F (37.2 C) (Axillary)   Resp 17   SpO2 98%  SpO2: SpO2: 98 % O2  Device: O2 Device: Room Air O2 Flow Rate: O2 Flow Rate (L/min): 4 L/min  Intake/output summary:   Intake/Output Summary (Last 24 hours) at 03/22/2021 1043 Last data filed at 03/22/2021 0951 Gross per 24 hour  Intake 363 ml  Output 0 ml  Net 363 ml   LBM: Last BM Date:  (dementia pt, unsure of last BM) Baseline Weight:   Most recent weight:       Patient Active Problem List   Diagnosis Date Noted  . AMS (altered mental status) 03/20/2021  . Acute respiratory failure with hypoxia ([place]) 03/19/2021  . Dementia ([place]) 03/19/2021  . Fall at home, initial encounter 03/19/2021  . Dysuria 10/28/2019  . Memory loss 06/05/2018  . HLD (hyperlipidemia)   . Urge urinary incontinence   . RBBB   . Osteoporosis   . Female cystocele   . Closed compression fracture of L1 lumbar vertebra   . Hyperglycemia 09/05/2017  . Preventative health care 09/05/2017  . Abdominal mass 09/05/2017  . Anemia 09/05/2017  . Hypertension   . Hypothyroidism   . Allergic rhinitis   . Fracture of humerus, proximal, right, closed 12/09/2014  . Closed 4-part fracture of proximal end of right humerus 12/01/2014    Palliative Care Assessment & Plan   Patient Profile: Per Vinie Sill NP note ->87 y.o. female  with past medical history of advanced dementia, hypertension, hyperlipidemia, hypothyroidism, recurrent falls admitted on 03/19/2021 with unwitnessed fall with no fractures or injury on scans.   Assessment: Acute respiratory failure with hypoxia SIRS,  resolved Dementia Hypothyroidism Recurrent falls Goals of care conversation  Recommendations/Plan:  Agree patient is appropriate for discharge to SNF with hospice, likely not eligible for residential hospice at this time  Confirmed with patient's son, goal remains d/c to [facility] SNF with hospice today  Goals of Care and Additional Recommendations:  Limitations on Scope of Treatment: transition to hospice upon discharge  Prognosis:   < 6 months  Discharge Planning:  [place] with Hospice  Care plan was discussed with Dr. Erlinda Hong, patient's son  Total time: 15 minutes Greater than 50% of this time was spent in counseling and coordinating care related to the above assessment and plan.  Dorthy Cooler, PA-C Palliative Medicine Team Team phone # [phone]  Thank you for allowing the Palliative Medicine Team to assist in the care of this patient. Please utilize secure chat with additional questions, if there is no response within 30 minutes please call the above phone number.  Palliative Medicine Team providers are available by phone from 7am to 7pm daily and can be reached through the team cell phone.  Should this patient require assistance outside of these hours, please call the patient's attending physician.

## 2021-03-22 NOTE — TOC Transition Note (Signed)
Transition of Care [facility]) - CM/SW Discharge Note  Patient Details  Name: Emily Spence MRN: [id number] Date of Birth: February 21, 1922  Transition of Care [facility]) CM/SW Contact:  Sherie Don, LCSW Phone Number: 03/22/2021, 11:47 AM  Clinical Narrative: Patient ready to be discharged to [facility] under long-term care with hospice services. FL2 completed. Discharge summary, discharge orders, SNF transfer report, and FL2 faxed to facility in hub. Patient's COVID test was negative. Medical necessity form done; PTAR scheduled. Discharge packet complete. CSW updated [place] and [place] with [employer]. CSW also updated Hoyle Sauer with [facility] and patient's son. TOC signing off.  Final next level of care: [place] Barriers to Discharge: Barriers Resolved  Patient Goals and CMS Choice Patient states their goals for this hospitalization and ongoing recovery are:: Go to [facility] SNF under hospice CMS Medicare.gov Compare Post Acute Care list provided to:: Patient Represenative (must comment) Choice offered to / list presented to : Adult Children  Discharge Placement       Patient chooses bed at: [place] Patient to be transferred to facility by: [place] Name of family member notified: Luxe Cuadros (son) Patient and family notified of of transfer: 03/22/21  Discharge Plan and Services In-house Referral: Clinical Social Work Post Acute Care Choice: Hospice          DME Arranged: N/A DME Agency: NA  Readmission Risk Interventions No flowsheet data found.

## 2021-03-22 NOTE — Discharge Summary (Signed)
Discharge Summary  Emily Spence IZT:[id number] DOB: April 26, 1922  PCP: Biagio Borg, MD  Admit date: 03/19/2021 Discharge date: 03/22/2021  Time spent: 29mins  Recommendations for Outpatient Follow-up:  1. F/u with SNF MD  for hospital discharge follow up 2. Discharge to SNF with hospice     Discharge Diagnoses:  Active Hospital Problems   Diagnosis Date Noted  . Acute respiratory failure with hypoxia ([place]) 03/19/2021  . AMS (altered mental status) 03/20/2021  . Dementia ([place]) 03/19/2021  . Fall at home, initial encounter 03/19/2021  . HLD (hyperlipidemia)   . Hypertension   . Hypothyroidism     Resolved Hospital Problems  No resolved problems to display.    Discharge Condition: stable  Diet recommendation: regular diet  There were no vitals filed for this visit.  History of present illness: ( per admitting MD Dr Neysa Bonito) History obtained from prior notes and patient's son at bedside due to patient's advanced dementia  This is a 87 year old female with past medical history of advanced dementia, hypertension, hyperlipidemia, hypothyroidism, recurrent falls, RBBB was brought in from her independent living facility after she was found on the floor from suspected unwitnessed fall today.  Patient's son says that the patient has been living in an independent living facility for the past 20+ years with a dog and bird and says she is essentially bedbound but will occasionally ambulate to the bathroom and back to bed.  He visits her daily to and from work and she has an aide who visits regularly as well.  After the patient's son had seen her this morning the patient's aide had checked on her about 2 hours later and found her on the floor suspected due to an unknown witnessed fall.  Unable to obtain an ROS at this time due to patient's advanced dementia.   After further discussion with patient's son, he thinks that she needs a higher level of care as opposed to her independent  living facility.  He understands that his mother has advanced dementia and seems to want to treat the treatable but not pursue any aggressive measures and is open to speaking with palliative care.  ED Course: Afebrile, hypertensive, intermittently hypoxic (SpO2 83-90% on room air) placed on 4 L/min however repeatedly takes off her nasal cannula. Notable Labs: BNP 114, troponin 6, WBC 12.8, Hb 15.1, platelets 141, COVID-19 pending and otherwise labs unremarkable. Notable Imaging: CXR, CT head/cervical spine and hip XR unremarkable.In the ED, patient was agitated and received Haldol  Hospital Course:  Principal Problem:   Acute respiratory failure with hypoxia ([place]) Active Problems:   Hypertension   Hypothyroidism   HLD (hyperlipidemia)   Dementia ([place])   Fall at home, initial encounter   AMS (altered mental status)   Goals of Care:  Pt previously in ILF, will need higher level of care.  D/c to  [facility] with hospice, .appreciate palliative care consult.   Acute hypoxic respiratory failure, most likely from atelectasisvs CAP vs less likelyvolume overload a. Currently satting low 90's on RA, with no increased WOB b. CXR unremarkable c. D/c abx with low procal d. Low suspicion for PE. D/c to  [facility] with hospice, .appreciate palliative care consult.    Unwitnessed fall No fractures noted on imaging D/c to  [facility] with hospice, .appreciate palliative care consult.    3. Advanced dementia a. Palliative care consult b. Delirium precautions c. Ok for family to stay overnight given delirium   4. SIRS less likely sepsis  a. Technically  meets sepsis criteria: Tachypnea, leukocytosis and lactic acidosis, however clinically not septic b. Resolved  c. Follow blood cx d. ua ordered, not obtained  e. abx d/c'd D/c to  [facility] with hospice, .appreciate palliative care consult.    5. Hypertension a. Continue home  amlodipine  6. Hypothyroidism a. Levothyroxine  DVT prophylaxis while in the hospital: heparin Code Status: DNR Family Communication: son over the phone Disposition:     Dispo: The patient is from: independent living facility  Anticipated d/c is to:D/c to  [facility] with hospice on 5/26    Consultants:   Palliative care  Procedures:  none  Antimicrobials:  Anti-infectives (From admission, onward)   Start     Dose/Rate Route Frequency Ordered Stop   03/19/21 2200  doxycycline (VIBRA-TABS) tablet 100 mg  Status:  Discontinued        100 mg Oral Every 12 hours 03/19/21 1801 03/20/21 1505   03/19/21 1800  cefTRIAXone (ROCEPHIN) 1 g in sodium chloride 0.9 % 100 mL IVPB  Status:  Discontinued        1 g 200 mL/hr over 30 Minutes Intravenous Every 24 hours 03/19/21 1801 03/20/21 1505       Discharge Exam: BP (!) 172/69 (BP Location: Left Leg)   Pulse 96   Temp 98.9 F (37.2 C) (Axillary)   Resp 17   SpO2 98%   General: alert, calm but demented, only oriented to self Cardiovascular: rrr Respiratory: normal respiratory effort  Discharge Instructions You were cared for by a hospitalist during your hospital stay. If you have any questions about your discharge medications or the care you received while you were in the hospital after you are discharged, you can call the unit and asked to speak with the hospitalist on call if the hospitalist that took care of you is not available. Once you are discharged, your primary care physician will handle any further medical issues. Please note that NO REFILLS for any discharge medications will be authorized once you are discharged, as it is imperative that you return to your primary care physician (or establish a relationship with a primary care physician if you do not have one) for your aftercare needs so that they can reassess your need for medications and monitor your lab values.  Discharge Instructions    Diet  general   Complete by: As directed    Increase activity slowly   Complete by: As directed      Allergies as of 03/22/2021      Reactions   Elemental Sulfur    unknown   Simvastatin    unknown      Medication List    STOP taking these medications   donepezil 5 MG tablet Commonly known as: ARICEPT     TAKE these medications   amLODipine 2.5 MG tablet Commonly known as: NORVASC TAKE 1 TABLET BY MOUTH  DAILY   cholecalciferol 1000 units tablet Commonly known as: VITAMIN D Take 1,000 Units by mouth daily.   levothyroxine 50 MCG tablet Commonly known as: SYNTHROID TAKE 1 TABLET BY MOUTH  DAILY EXCEPT TAKE 1 AND 1/2 TABLETS ON TUESDAY AND  THURSDAY What changed: See the new instructions.   QUEtiapine 25 MG tablet Commonly known as: SEROQUEL Take 1 tablet (25 mg total) by mouth at bedtime.      Allergies  Allergen Reactions  . Elemental Sulfur     unknown  . Simvastatin     unknown    Contact information for after-discharge care  Destination    [facility] .   Service: Skilled Nursing Contact information: [address] [place] [phone]                   The results of significant diagnostics from this hospitalization (including imaging, microbiology, ancillary and laboratory) are listed below for reference.    Significant Diagnostic Studies: DG Chest 1 View  Result Date: 03/19/2021 CLINICAL DATA:  Patient found down this morning. EXAM: CHEST  1 VIEW COMPARISON:  Single-view of the chest 12/01/2014. FINDINGS: Lung volumes are low with some basilar atelectasis. No pneumothorax or pleural effusion. Heart size is normal. Aortic atherosclerosis. The patient is status post reverse right shoulder arthroplasty. No acute bony abnormality. IMPRESSION: No acute disease. Aortic Atherosclerosis (ICD10-I70.0). Electronically Signed   By: Inge Rise M.D.   On: 03/19/2021 14:20   CT Head Wo Contrast  Result  Date: 03/19/2021 CLINICAL DATA:  Dementia.  Fall.  Trauma to the head and neck. EXAM: CT HEAD WITHOUT CONTRAST CT CERVICAL SPINE WITHOUT CONTRAST TECHNIQUE: Multidetector CT imaging of the head and cervical spine was performed following the standard protocol without intravenous contrast. Multiplanar CT image reconstructions of the cervical spine were also generated. COMPARISON:  None. FINDINGS: CT HEAD FINDINGS Brain: The study suffers from some motion degradation. There is generalized atrophy. There chronic small-vessel ischemic changes of the white matter. No sign of acute infarction, mass lesion, hemorrhage, hydrocephalus or extra-axial collection. Vascular: There is atherosclerotic calcification of the major vessels at the base of the brain. Skull: Negative Sinuses/Orbits: Clear/normal Other: None CT CERVICAL SPINE FINDINGS Alignment: No traumatic malalignment. Skull base and vertebrae: No regional fracture or primary bone lesion. Soft tissues and spinal canal: Negative Disc levels: Chronic degenerative spondylosis from C3-4 through C6-7. This is most pronounced at C4-5 and C5-6 where there is disc space narrowing and endplate osteophytes with foraminal narrowing right worse than left at C4-5 and bilateral at C5-6. there is facet degenerative disease on the left at C7-T1. Upper chest: Negative Other: None IMPRESSION: Head CT: No acute or traumatic finding. Atrophy and chronic small-vessel ischemic changes of the white matter. Cervical spine CT: No acute or traumatic finding. Ordinary cervical spondylosis. Electronically Signed   By: Nelson Chimes M.D.   On: 03/19/2021 14:15   CT Cervical Spine Wo Contrast  Result Date: 03/19/2021 CLINICAL DATA:  Dementia.  Fall.  Trauma to the head and neck. EXAM: CT HEAD WITHOUT CONTRAST CT CERVICAL SPINE WITHOUT CONTRAST TECHNIQUE: Multidetector CT imaging of the head and cervical spine was performed following the standard protocol without intravenous contrast. Multiplanar  CT image reconstructions of the cervical spine were also generated. COMPARISON:  None. FINDINGS: CT HEAD FINDINGS Brain: The study suffers from some motion degradation. There is generalized atrophy. There chronic small-vessel ischemic changes of the white matter. No sign of acute infarction, mass lesion, hemorrhage, hydrocephalus or extra-axial collection. Vascular: There is atherosclerotic calcification of the major vessels at the base of the brain. Skull: Negative Sinuses/Orbits: Clear/normal Other: None CT CERVICAL SPINE FINDINGS Alignment: No traumatic malalignment. Skull base and vertebrae: No regional fracture or primary bone lesion. Soft tissues and spinal canal: Negative Disc levels: Chronic degenerative spondylosis from C3-4 through C6-7. This is most pronounced at C4-5 and C5-6 where there is disc space narrowing and endplate osteophytes with foraminal narrowing right worse than left at C4-5 and bilateral at C5-6. there is facet degenerative disease on the left at C7-T1. Upper chest: Negative Other:  None IMPRESSION: Head CT: No acute or traumatic finding. Atrophy and chronic small-vessel ischemic changes of the white matter. Cervical spine CT: No acute or traumatic finding. Ordinary cervical spondylosis. Electronically Signed   By: Nelson Chimes M.D.   On: 03/19/2021 14:15   DG Hip Unilat With Pelvis 2-3 Views Right  Result Date: 03/19/2021 CLINICAL DATA:  Fall, unwitnessed fall generalized pain. EXAM: DG HIP (WITH OR WITHOUT PELVIS) 2-3V RIGHT COMPARISON:  April 26, 2020. FINDINGS: No signs of fracture of the bony pelvis. Soft tissues are unremarkable. RIGHT hip is located without visible fracture. Osteopenia and spinal degenerative changes. IMPRESSION: 1. No fracture or dislocation of the RIGHT hip. 2. Osteopenia and spinal degenerative changes. Electronically Signed   By: Zetta Bills M.D.   On: 03/19/2021 14:24    Microbiology: Recent Results (from the past 240 hour(s))  Resp Panel by RT-PCR  (Flu A&B, Covid) Nasopharyngeal Swab     Status: None   Collection Time: 03/19/21  1:16 PM   Specimen: Nasopharyngeal Swab; Nasopharyngeal(NP) swabs in vial transport medium  Result Value Ref Range Status   SARS Coronavirus 2 by RT PCR NEGATIVE NEGATIVE Final    Comment: (NOTE) SARS-CoV-2 target nucleic acids are NOT DETECTED.  The SARS-CoV-2 RNA is generally detectable in upper respiratory specimens during the acute phase of infection. The lowest concentration of SARS-CoV-2 viral copies this assay can detect is 138 copies/mL. A negative result does not preclude SARS-Cov-2 infection and should not be used as the sole basis for treatment or other patient management decisions. A negative result may occur with  improper specimen collection/handling, submission of specimen other than nasopharyngeal swab, presence of viral mutation(s) within the areas targeted by this assay, and inadequate number of viral copies(<138 copies/mL). A negative result must be combined with clinical observations, patient history, and epidemiological information. The expected result is Negative.  Fact Sheet for Patients:  EntrepreneurPulse.com.au  Fact Sheet for Healthcare Providers:  IncredibleEmployment.be  This test is no t yet approved or cleared by the [place] FDA and  has been authorized for detection and/or diagnosis of SARS-CoV-2 by FDA under an Emergency Use Authorization (EUA). This EUA will remain  in effect (meaning this test can be used) for the duration of the COVID-19 declaration under Section 564(b)(1) of the Act, 21 U.S.C.section 360bbb-3(b)(1), unless the authorization is terminated  or revoked sooner.       Influenza A by PCR NEGATIVE NEGATIVE Final   Influenza B by PCR NEGATIVE NEGATIVE Final    Comment: (NOTE) The Xpert Xpress SARS-CoV-2/FLU/RSV plus assay is intended as an aid in the diagnosis of influenza from Nasopharyngeal swab specimens  and should not be used as a sole basis for treatment. Nasal washings and aspirates are unacceptable for Xpert Xpress SARS-CoV-2/FLU/RSV testing.  Fact Sheet for Patients: EntrepreneurPulse.com.au  Fact Sheet for Healthcare Providers: IncredibleEmployment.be  This test is not yet approved or cleared by the [place] FDA and has been authorized for detection and/or diagnosis of SARS-CoV-2 by FDA under an Emergency Use Authorization (EUA). This EUA will remain in effect (meaning this test can be used) for the duration of the COVID-19 declaration under Section 564(b)(1) of the Act, 21 U.S.C. section 360bbb-3(b)(1), unless the authorization is terminated or revoked.  Performed at [facility], [place] [address], [place], [place] [address]   Culture, blood (single)     Status: None (Preliminary result)   Collection Time: 03/19/21  2:00 PM   Specimen: Site Not Specified; Blood  Result Value Ref Range Status   Specimen Description   Final    SITE NOT SPECIFIED Performed at [facility] [address], [place], [place] [address]    Special Requests   Final    BOTTLES DRAWN AEROBIC ONLY Blood Culture adequate volume Performed at [facility] [address] [place], [place] [address]    Culture   Final    NO GROWTH 3 DAYS Performed at [facility], [place] [address] [place], [place] [address]    Report Status PENDING  Incomplete  SARS CORONAVIRUS 2 (TAT 6-24 HRS) Nasopharyngeal Nasopharyngeal Swab     Status: None   Collection Time: 03/21/21  1:46 PM   Specimen: Nasopharyngeal Swab  Result Value Ref Range Status   SARS Coronavirus 2 NEGATIVE NEGATIVE Final    Comment: (NOTE) SARS-CoV-2 target nucleic acids are NOT DETECTED.  The SARS-CoV-2 RNA is generally detectable in upper and lower respiratory specimens during the acute phase of infection. Negative results do not preclude  SARS-CoV-2 infection, do not rule out co-infections with other pathogens, and should not be used as the sole basis for treatment or other patient management decisions. Negative results must be combined with clinical observations, patient history, and epidemiological information. The expected result is Negative.  Fact Sheet for Patients: SugarRoll.be  Fact Sheet for Healthcare Providers: https://www.woods-mathews.com/  This test is not yet approved or cleared by the [place] FDA and  has been authorized for detection and/or diagnosis of SARS-CoV-2 by FDA under an Emergency Use Authorization (EUA). This EUA will remain  in effect (meaning this test can be used) for the duration of the COVID-19 declaration under Se ction 564(b)(1) of the Act, 21 U.S.C. section 360bbb-3(b)(1), unless the authorization is terminated or revoked sooner.  Performed at [facility], [place] [address], [place], [place] [address]      Labs: Basic Metabolic Panel: Recent Labs  Lab 03/19/21 1316 03/20/21 0321 03/21/21 0323  NA 142 140 141  K 4.0 4.5 4.0  CL 106 105 105  CO2 25 27 27   GLUCOSE 135* 141* 166*  BUN 14 16 12   CREATININE 0.80 0.98 0.70  CALCIUM 9.2 9.0 8.9  MG  --   --  2.1  PHOS  --   --  2.2*   Liver Function Tests: Recent Labs  Lab 03/19/21 1316 03/21/21 0323  AST 25 26  ALT 18 16  ALKPHOS 55 55  BILITOT 0.8 1.2  PROT 7.4 7.5  ALBUMIN 4.2 3.8   No results for input(s): LIPASE, AMYLASE in the last 168 hours. No results for input(s): AMMONIA in the last 168 hours. CBC: Recent Labs  Lab 03/19/21 1316 03/20/21 0321 03/21/21 0323  WBC 12.8* 9.7 11.3*  NEUTROABS 10.9*  --  9.4*  HGB 15.1* 15.1* 15.9*  HCT 46.2* 46.5* 49.1*  MCV 94.5 94.7 95.3  PLT 141* 131* 125*   Cardiac Enzymes: No results for input(s): CKTOTAL, CKMB, CKMBINDEX, TROPONINI in the last 168 hours. BNP: BNP (last 3 results) Recent Labs     03/19/21 1316  BNP 114.0*    ProBNP (last 3 results) No results for input(s): PROBNP in the last 8760 hours.  CBG: No results for input(s): GLUCAP in the last 168 hours.     Signed:  Florencia Reasons MD, PhD, FACP  [facility] 03/22/2021, 9:10 AM

## 2021-03-24 LAB — CULTURE, BLOOD (SINGLE)
Culture: NO GROWTH
Special Requests: ADEQUATE

## 2021-03-27 ENCOUNTER — Telehealth: Payer: Self-pay | Admitting: Internal Medicine

## 2021-03-27 NOTE — Telephone Encounter (Signed)
Very sorry to hear that.  Please let us know if anything else we can do.

## 2021-03-27 NOTE — Telephone Encounter (Signed)
Patients son wanted to let Dr. Jenny Reichmann know that the patient passes away yesterday 2021-04-07. Please advise

## 2021-03-28 DEATH — deceased
